# Patient Record
Sex: Female | Born: 1970 | Race: Black or African American | Hispanic: No | Marital: Single | Smoking: Never smoker
Health system: Southern US, Community
[De-identification: ages and names within clinical notes are randomized; demographics above are authoritative.]

## PROBLEM LIST (undated history)

## (undated) DIAGNOSIS — T4145XA Adverse effect of unspecified anesthetic, initial encounter: Secondary | ICD-10-CM

## (undated) DIAGNOSIS — D219 Benign neoplasm of connective and other soft tissue, unspecified: Secondary | ICD-10-CM

## (undated) DIAGNOSIS — D649 Anemia, unspecified: Secondary | ICD-10-CM

## (undated) DIAGNOSIS — Q858 Other phakomatoses, not elsewhere classified: Secondary | ICD-10-CM

## (undated) DIAGNOSIS — Q8589 Other phakomatoses, not elsewhere classified: Secondary | ICD-10-CM

## (undated) DIAGNOSIS — T8859XA Other complications of anesthesia, initial encounter: Secondary | ICD-10-CM

## (undated) DIAGNOSIS — M1990 Unspecified osteoarthritis, unspecified site: Secondary | ICD-10-CM

## (undated) HISTORY — PX: TUBAL LIGATION: SHX77

## (undated) HISTORY — PX: COLONOSCOPY: SHX174

## (undated) HISTORY — PX: APPENDECTOMY: SHX54

## (undated) HISTORY — PX: ABDOMINAL SURGERY: SHX537

---

## 1998-04-05 ENCOUNTER — Emergency Department (HOSPITAL_COMMUNITY): Admission: EM | Admit: 1998-04-05 | Discharge: 1998-04-05 | Payer: Self-pay | Admitting: Emergency Medicine

## 1998-05-30 ENCOUNTER — Emergency Department (HOSPITAL_COMMUNITY): Admission: EM | Admit: 1998-05-30 | Discharge: 1998-05-30 | Payer: Self-pay | Admitting: Emergency Medicine

## 1998-07-03 ENCOUNTER — Other Ambulatory Visit: Admission: RE | Admit: 1998-07-03 | Discharge: 1998-07-03 | Payer: Self-pay | Admitting: Internal Medicine

## 1998-07-31 ENCOUNTER — Encounter: Admission: RE | Admit: 1998-07-31 | Discharge: 1998-07-31 | Payer: Self-pay | Admitting: Obstetrics & Gynecology

## 1999-09-01 ENCOUNTER — Other Ambulatory Visit: Admission: RE | Admit: 1999-09-01 | Discharge: 1999-09-01 | Payer: Self-pay | Admitting: *Deleted

## 1999-09-01 ENCOUNTER — Other Ambulatory Visit: Admission: RE | Admit: 1999-09-01 | Discharge: 1999-09-01 | Payer: Self-pay | Admitting: Obstetrics and Gynecology

## 1999-09-10 ENCOUNTER — Inpatient Hospital Stay (HOSPITAL_COMMUNITY): Admission: AD | Admit: 1999-09-10 | Discharge: 1999-09-14 | Payer: Self-pay | Admitting: Gastroenterology

## 1999-09-12 ENCOUNTER — Encounter: Payer: Self-pay | Admitting: Gastroenterology

## 1999-10-02 ENCOUNTER — Ambulatory Visit (HOSPITAL_COMMUNITY): Admission: RE | Admit: 1999-10-02 | Discharge: 1999-10-02 | Payer: Self-pay | Admitting: Surgery

## 1999-10-02 ENCOUNTER — Encounter: Payer: Self-pay | Admitting: Surgery

## 1999-11-21 ENCOUNTER — Inpatient Hospital Stay (HOSPITAL_COMMUNITY): Admission: AD | Admit: 1999-11-21 | Discharge: 1999-11-26 | Payer: Self-pay | Admitting: Surgery

## 2000-01-22 ENCOUNTER — Ambulatory Visit (HOSPITAL_COMMUNITY): Admission: RE | Admit: 2000-01-22 | Discharge: 2000-01-22 | Payer: Self-pay | Admitting: Gastroenterology

## 2000-02-01 ENCOUNTER — Emergency Department (HOSPITAL_COMMUNITY): Admission: EM | Admit: 2000-02-01 | Discharge: 2000-02-02 | Payer: Self-pay | Admitting: Emergency Medicine

## 2000-07-04 ENCOUNTER — Other Ambulatory Visit: Admission: RE | Admit: 2000-07-04 | Discharge: 2000-07-04 | Payer: Self-pay | Admitting: *Deleted

## 2000-07-07 ENCOUNTER — Encounter: Admission: RE | Admit: 2000-07-07 | Discharge: 2000-07-07 | Payer: Self-pay | Admitting: *Deleted

## 2000-07-07 ENCOUNTER — Encounter: Payer: Self-pay | Admitting: *Deleted

## 2000-08-23 ENCOUNTER — Other Ambulatory Visit: Admission: RE | Admit: 2000-08-23 | Discharge: 2000-08-23 | Payer: Self-pay | Admitting: *Deleted

## 2000-09-15 ENCOUNTER — Other Ambulatory Visit: Admission: RE | Admit: 2000-09-15 | Discharge: 2000-09-15 | Payer: Self-pay | Admitting: Obstetrics and Gynecology

## 2000-10-21 ENCOUNTER — Ambulatory Visit (HOSPITAL_COMMUNITY): Admission: RE | Admit: 2000-10-21 | Discharge: 2000-10-21 | Payer: Self-pay | Admitting: Gastroenterology

## 2000-10-21 ENCOUNTER — Encounter: Payer: Self-pay | Admitting: Gastroenterology

## 2000-10-26 ENCOUNTER — Ambulatory Visit (HOSPITAL_COMMUNITY): Admission: RE | Admit: 2000-10-26 | Discharge: 2000-10-26 | Payer: Self-pay | Admitting: Gastroenterology

## 2000-11-11 ENCOUNTER — Ambulatory Visit (HOSPITAL_COMMUNITY): Admission: RE | Admit: 2000-11-11 | Discharge: 2000-11-11 | Payer: Self-pay | Admitting: Gastroenterology

## 2001-02-21 ENCOUNTER — Encounter: Admission: RE | Admit: 2001-02-21 | Discharge: 2001-02-21 | Payer: Self-pay | Admitting: Obstetrics and Gynecology

## 2001-02-21 ENCOUNTER — Encounter: Payer: Self-pay | Admitting: Obstetrics and Gynecology

## 2001-09-15 ENCOUNTER — Ambulatory Visit (HOSPITAL_COMMUNITY): Admission: RE | Admit: 2001-09-15 | Discharge: 2001-09-15 | Payer: Self-pay | Admitting: Gastroenterology

## 2001-09-15 ENCOUNTER — Encounter: Payer: Self-pay | Admitting: Gastroenterology

## 2001-11-16 ENCOUNTER — Encounter: Payer: Self-pay | Admitting: Obstetrics and Gynecology

## 2001-11-16 ENCOUNTER — Encounter: Admission: RE | Admit: 2001-11-16 | Discharge: 2001-11-16 | Payer: Self-pay | Admitting: Obstetrics and Gynecology

## 2001-12-20 ENCOUNTER — Ambulatory Visit (HOSPITAL_COMMUNITY): Admission: RE | Admit: 2001-12-20 | Discharge: 2001-12-20 | Payer: Self-pay | Admitting: Gastroenterology

## 2001-12-29 ENCOUNTER — Encounter: Payer: Self-pay | Admitting: *Deleted

## 2001-12-29 ENCOUNTER — Ambulatory Visit (HOSPITAL_COMMUNITY): Admission: RE | Admit: 2001-12-29 | Discharge: 2001-12-29 | Payer: Self-pay | Admitting: *Deleted

## 2002-05-04 ENCOUNTER — Encounter: Payer: Self-pay | Admitting: Obstetrics and Gynecology

## 2002-05-04 ENCOUNTER — Encounter: Admission: RE | Admit: 2002-05-04 | Discharge: 2002-05-04 | Payer: Self-pay | Admitting: Obstetrics and Gynecology

## 2002-06-04 ENCOUNTER — Ambulatory Visit (HOSPITAL_COMMUNITY): Admission: RE | Admit: 2002-06-04 | Discharge: 2002-06-04 | Payer: Self-pay | Admitting: Gastroenterology

## 2002-06-18 ENCOUNTER — Other Ambulatory Visit: Admission: RE | Admit: 2002-06-18 | Discharge: 2002-06-18 | Payer: Self-pay | Admitting: Obstetrics & Gynecology

## 2003-01-21 ENCOUNTER — Ambulatory Visit (HOSPITAL_COMMUNITY): Admission: RE | Admit: 2003-01-21 | Discharge: 2003-01-21 | Payer: Self-pay | Admitting: Gastroenterology

## 2012-02-03 DIAGNOSIS — R928 Other abnormal and inconclusive findings on diagnostic imaging of breast: Secondary | ICD-10-CM | POA: Insufficient documentation

## 2012-02-03 DIAGNOSIS — M19049 Primary osteoarthritis, unspecified hand: Secondary | ICD-10-CM | POA: Insufficient documentation

## 2012-02-03 DIAGNOSIS — G5620 Lesion of ulnar nerve, unspecified upper limb: Secondary | ICD-10-CM | POA: Insufficient documentation

## 2012-02-03 DIAGNOSIS — G44229 Chronic tension-type headache, not intractable: Secondary | ICD-10-CM | POA: Insufficient documentation

## 2012-02-03 DIAGNOSIS — M542 Cervicalgia: Secondary | ICD-10-CM | POA: Insufficient documentation

## 2012-02-11 DIAGNOSIS — F4321 Adjustment disorder with depressed mood: Secondary | ICD-10-CM | POA: Insufficient documentation

## 2014-03-29 HISTORY — PX: BREAST BIOPSY: SHX20

## 2017-08-08 ENCOUNTER — Ambulatory Visit
Admission: EM | Admit: 2017-08-08 | Discharge: 2017-08-08 | Disposition: A | Discharge status: Home / Self Care | Payer: BLUE CROSS/BLUE SHIELD | Attending: Family Medicine | Admitting: Family Medicine

## 2017-08-08 ENCOUNTER — Other Ambulatory Visit: Payer: Self-pay

## 2017-08-08 ENCOUNTER — Encounter: Payer: Self-pay | Admitting: Emergency Medicine

## 2017-08-08 DIAGNOSIS — N6452 Nipple discharge: Secondary | ICD-10-CM | POA: Diagnosis not present

## 2017-08-08 DIAGNOSIS — N898 Other specified noninflammatory disorders of vagina: Secondary | ICD-10-CM | POA: Diagnosis not present

## 2017-08-08 DIAGNOSIS — N6310 Unspecified lump in the right breast, unspecified quadrant: Secondary | ICD-10-CM

## 2017-08-08 DIAGNOSIS — N760 Acute vaginitis: Secondary | ICD-10-CM

## 2017-08-08 DIAGNOSIS — B9689 Other specified bacterial agents as the cause of diseases classified elsewhere: Secondary | ICD-10-CM | POA: Diagnosis not present

## 2017-08-08 DIAGNOSIS — N6341 Unspecified lump in right breast, subareolar: Secondary | ICD-10-CM | POA: Diagnosis not present

## 2017-08-08 HISTORY — DX: Other phakomatoses, not elsewhere classified: Q85.8

## 2017-08-08 HISTORY — DX: Other phakomatoses, not elsewhere classified: Q85.89

## 2017-08-08 LAB — WET PREP, GENITAL
Sperm: NONE SEEN
Trich, Wet Prep: NONE SEEN
Yeast Wet Prep HPF POC: NONE SEEN

## 2017-08-08 MED ORDER — METRONIDAZOLE 500 MG PO TABS: 500.0000 mg | ORAL_TABLET | Freq: Two times a day (BID) | ORAL | 0 refills | Status: DC

## 2017-08-08 NOTE — Discharge Instructions (Addendum)
Recommend patient follow up this week with breast surgeon for further evaluation and management

## 2017-08-08 NOTE — ED Triage Notes (Addendum)
Patient in today c/o right breast nipple discharge x 3 weeks. Patient states this happened last month during her menstrual cycle, but the discharged stopped once her cycle completed. This time the discharged has continued now for 2 weeks past her cycle. Patient has an appointment to establish care with OB/GYN the end of June.

## 2017-08-08 NOTE — ED Provider Notes (Signed)
[facility]    CSN: [id number] Arrival date & time: 08/08/17  1536     History   Chief Complaint Chief Complaint  Patient presents with  . Breast Discharge    HPI Ann Kennedy is a 47 y.o. female.   47 yo female with a c/o right breast nipple discharge for the past 3 weeks. States discharge is mostly yellowish color but also sometimes bloody and only from the right breast. Denies any trauma, skin redness, rash, fevers, chills. States also has a lump under the nipple that has been present for years and was biopsied 2 years ago at [facility]. Patient states she was told at the time that it was a fibroadenoma.   Patient also c/o a mild vaginal discharge for the past few weeks. Denies any pain or fevers. States not concerned about STDs and states can do a self swab for wet prep.   The history is provided by the patient.    Past Medical History:  Diagnosis Date  . 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   Past Surgical History:  Procedure Laterality Date  . ABDOMINAL SURGERY     Puetz-Jeghers Syndrome  . APPENDECTOMY      OB History   None      Home Medications    Prior to Admission medications   Medication Sig Start Date End Date Taking? Authorizing Provider  metroNIDAZOLE (FLAGYL) 500 MG tablet Take 1 tablet (500 mg total) by mouth 2 (two) times daily. 08/08/17   Norval Gable, MD    Family History Family History  Problem Relation Age of Onset  . Colon cancer Father   . Other Father        Peutz-Jegher syndrome    Social History Social History   Tobacco Use  . Smoking status: Never Smoker  . Smokeless tobacco: Never Used  Substance Use Topics  . Alcohol use: Yes    Alcohol/week: 1.2 oz    Types: 2 Glasses of wine per week  . Drug use: Never     Allergies   Aspirin   Review of Systems Review of Systems   Physical Exam Triage Vital Signs ED Triage Vitals  Enc Vitals Group       BP 08/08/17 1552 108/72     Pulse Rate 08/08/17 1552 80     Resp 08/08/17 1552 16     Temp 08/08/17 1552 98.4 F (36.9 C)     Temp Source 08/08/17 1552 Oral     SpO2 08/08/17 1552 100 %     Weight 08/08/17 1553 143 lb (64.9 kg)     Height 08/08/17 1553 5' 6.5" (1.689 m)     Head Circumference --      Peak Flow --      Pain Score 08/08/17 1552 0     Pain Loc --      Pain Edu? --      Excl. in [place]? --    No data found.  Updated Vital Signs BP 108/72 (BP Location: Left Arm)   Pulse 80   Temp 98.4 F (36.9 C) (Oral)   Resp 16   Ht 5' 6.5" (1.689 m)   Wt 143 lb (64.9 kg)   LMP 07/18/2017 (Approximate)   SpO2 100%   BMI 22.74 kg/m   Visual Acuity Right Eye Distance:   Left Eye Distance:   Bilateral Distance:    Right Eye Near:  Left Eye Near:    Bilateral Near:     Physical Exam  Constitutional: She appears well-developed and well-nourished. No distress.  Pulmonary/Chest: Effort normal. No respiratory distress. Right breast exhibits mass. There is breast discharge (serousanguinous ).    Skin: She is not diaphoretic.  Nursing note and vitals reviewed.    UC Treatments / Results  Labs (all labs ordered are listed, but only abnormal results are displayed) Labs Reviewed  WET PREP, GENITAL - Abnormal; Notable for the following components:      Result Value   Clue Cells Wet Prep HPF POC PRESENT (*)    WBC, Wet Prep HPF POC FEW (*)    All other components within normal limits  AEROBIC CULTURE (SUPERFICIAL SPECIMEN)   (Self wet prep sample)  EKG None  Radiology No results found.  Procedures Procedures (including critical care time)  Medications Ordered in UC Medications - No data to display  Initial Impression / Assessment and Plan / UC Course  I have reviewed the triage vital signs and the nursing notes.  Pertinent labs & imaging results that were available during my care of the patient were reviewed by me and considered in my medical decision  making (see chart for details).     Final Clinical Impressions(s) / UC Diagnoses   Final diagnoses:  Bloody discharge from right nipple  Breast mass, right  BV (bacterial vaginosis)     Discharge Instructions     Recommend patient follow up this week with breast surgeon for further evaluation and management    ED Prescriptions    Medication Sig Dispense Auth. Provider   metroNIDAZOLE (FLAGYL) 500 MG tablet Take 1 tablet (500 mg total) by mouth 2 (two) times daily. 14 tablet Norval Gable, MD     1. Lab results and diagnosis reviewed with patient  2. rx as per orders above; reviewed possible side effects, interactions, risks and benefits  3. Recommend follow up with breast surgeon for further evaluation of her breast mass and bloody breast discharge; appointment scheduled for this week 4. Follow-up prn if symptoms worsen or don't improve   Controlled Substance Prescriptions [place] Controlled Substance Registry consulted? Not Applicable   Norval Gable, MD 08/08/17 2121

## 2017-08-08 NOTE — ED Triage Notes (Signed)
Appointment made with [facility], Dr. Dahlia Byes for Wednesday (08/10/17) at 2:30pm to be worked in. Patient notified of appointment and voiced understanding.

## 2017-08-09 DIAGNOSIS — Q8589 Other phakomatoses, not elsewhere classified: Secondary | ICD-10-CM | POA: Insufficient documentation

## 2017-08-09 DIAGNOSIS — Q858 Other phakomatoses, not elsewhere classified: Secondary | ICD-10-CM | POA: Insufficient documentation

## 2017-08-09 DIAGNOSIS — D649 Anemia, unspecified: Secondary | ICD-10-CM | POA: Insufficient documentation

## 2017-08-10 ENCOUNTER — Telehealth: Payer: Self-pay

## 2017-08-10 ENCOUNTER — Encounter: Payer: Self-pay | Admitting: Surgery

## 2017-08-10 ENCOUNTER — Ambulatory Visit: Payer: BLUE CROSS/BLUE SHIELD | Admitting: Surgery

## 2017-08-10 VITALS — BP 120/78 | HR 82 | Temp 98.3°F | Ht 66.0 in | Wt 154.4 lb

## 2017-08-10 DIAGNOSIS — N6452 Nipple discharge: Secondary | ICD-10-CM | POA: Diagnosis not present

## 2017-08-10 DIAGNOSIS — D241 Benign neoplasm of right breast: Secondary | ICD-10-CM | POA: Diagnosis not present

## 2017-08-10 NOTE — Patient Instructions (Addendum)
We would like for you to stop by [facility] breast center and sign a release of information so we can get a disc of the images of your Mammogram from 2016 you had done at [facility].  Once [facility] has the images we can then schedule the mammogram and Ultrasound and the follow up appointment with Dr.Pabon.   I will call you with the appointment information.

## 2017-08-10 NOTE — Telephone Encounter (Signed)
Spoke with Suanne Marker @ [facility] to schedule Mammogram and Ultrasound, however images are needed from [facility] before proceeding with appointment.  Patient was instructed after todays office visit to stop by [facility] and sign a release to obtain images from [place].

## 2017-08-10 NOTE — Progress Notes (Signed)
Patient ID: Ann Kennedy, female   DOB: 06/28/1970, 47 y.o.   MRN: [id number]  HPI Ann Kennedy is a 47 y.o. female seen in consultation at the request of Dr. Zenda Alpers.  3 she describes that she has been having right breast drainage for the last couple of years and over the last 3 weeks she sees some serious combined with some sanguinous drainage.  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 She denies any significant pain.  She does have a history of fibromyalgia with body aches.  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 No specific past medical history of breast cancer.  She does have some fibroids but no evidence of oophorectomies or hysterectomy. Menarche at age 65 history of Depo Provera several years ago now has tubal ligation.  She is perimenopausal. Is able to perform more than 4 METS of activity without any shortness of breath or chest pain.  HPI  Past Medical History:  Diagnosis Date  . Peutz-Jeghers syndrome [facility])     Past Surgical History:  Procedure Laterality Date  . ABDOMINAL SURGERY     Puetz-Jeghers Syndrome  . APPENDECTOMY      Family History  Problem Relation Age of Onset  . Colon cancer Father   . Other Father        Peutz-Jegher syndrome    Social History Social History   Tobacco Use  . Smoking status: Never Smoker  . Smokeless tobacco: Never Used  Substance Use Topics  . Alcohol use: Yes    Alcohol/week: 1.2 oz    Types: 2 Glasses of wine per week  . Drug use: Never    Allergies  Allergen Reactions  . Aspirin Nausea And Vomiting    Abdominal pain    Current Outpatient Medications  Medication Sig Dispense Refill  . amitriptyline (ELAVIL) 10 MG tablet TAKE 1TABLET BY MOUTH AT NIGHT AS NEEDED FOR SLEEP OR PAIN    . Ferrous Sulfate 134 MG  TABS Take by mouth.    . metroNIDAZOLE (FLAGYL) 500 MG tablet Take 1 tablet (500 mg total) by mouth 2 (two) times daily. 14 tablet 0  . Na Sulfate-K Sulfate-Mg Sulf (SUPREP BOWEL PREP KIT) 17.5-3.13-1.6 GM/177ML SOLN At 6 PM day before procedure, complete steps 1-4 using one 6 ounce bottle.  At 5 AM the day of procedure, repeat steps 1-4 using 2nd bottle.    . prenatal vitamin w/FE, FA (PRENATAL 1 + 1) 27-1 MG TABS tablet Take by mouth.     No current facility-administered medications for this visit.      Review of Systems Full ROS  was asked and was negative except for the information on the HPI  Physical Exam Blood pressure 120/78, pulse 82, temperature 98.3 F (36.8 C), temperature source Oral, height '5\' 6"'$  (1.676 m), weight 70 kg (154 lb 6.4 oz), last menstrual period 07/18/2017. CONSTITUTIONAL: NAD EYES: Pupils are equal, round, and reactive to light, Sclera are non-icteric. EARS, NOSE, MOUTH AND THROAT: The oropharynx is clear. The oral mucosa is pink and moist. Hearing is intact to voice. LYMPH NODES:  Lymph nodes in the neck are normal. BREAST: There is serous discharge from the right nipple.  There is a retroareolar density mass like tissue but I suspect this is her baseline dense breast tissue.  No  evidence of other additional masses or lesions within the skin.  Left breast is completely normal.  No evidence of axillary.lymphadenopathy RESPIRATORY:  Lungs are clear. There is normal respiratory effort, with equal breath sounds bilaterally, and without pathologic use of accessory muscles. CARDIOVASCULAR: Heart is regular without murmurs, gallops, or rubs. GI: The abdomen is  soft, nontender, and nondistended. There are no palpable masses. There is no hepatosplenomegaly. There are normal bowel sounds in all quadrants. GU: Rectal deferred.   MUSCULOSKELETAL: Normal muscle strength and tone. No cyanosis or edema.   SKIN: Turgor is good and there are no pathologic skin lesions or  ulcers. NEUROLOGIC: Motor and sensation is grossly normal. Cranial nerves are grossly intact. PSYCH:  Oriented to person, place and time. Affect is normal.  Data Reviewed I have personally reviewed the patient's imaging, laboratory findings and medical records.    Assessment/Plan 47 year old female with a right breast DISCHARGE consistent with ductal pathology.  Discussed with the patient in detail about her disease process.  First order of business is to obtain ultrasound and mammogram to rule out any potential malignancies.  I suspect that she may benefit from central ductal excision since this is a chronic problem.  We discussed briefly in detail about what this entails.  I will see her back in about 2 weeks after we complete her diagnostic his studies.  No need for emergent surgical intervention at this time.        Caroleen Hamman, MD FACS General Surgeon 08/10/2017, 5:10 PM

## 2017-08-11 LAB — AEROBIC CULTURE W GRAM STAIN (SUPERFICIAL SPECIMEN)
Culture: NO GROWTH
Gram Stain: NONE SEEN
Special Requests: NORMAL

## 2017-08-26 NOTE — Telephone Encounter (Signed)
Spoke with York Cerise  A request of authorization for images were re-faxed to [place]. As soon as they are received York Cerise will call me to let me know when Mammogram will be scheduled.

## 2017-08-30 ENCOUNTER — Inpatient Hospital Stay
Admission: RE | Admit: 2017-08-30 | Discharge: 2017-08-30 | Disposition: A | Discharge status: Home / Self Care | Payer: Self-pay | Source: Ambulatory Visit | Attending: *Deleted | Admitting: *Deleted

## 2017-08-30 ENCOUNTER — Other Ambulatory Visit: Payer: Self-pay | Admitting: *Deleted

## 2017-08-30 DIAGNOSIS — Z9289 Personal history of other medical treatment: Secondary | ICD-10-CM

## 2017-08-31 NOTE — Telephone Encounter (Signed)
Left message for patient to return call to office regarding follow up appointment listed below.  Dr.Pabon 09/21/17 @ 10 am to review Mammogram and Ultrasound results.

## 2017-09-01 ENCOUNTER — Ambulatory Visit: Payer: Self-pay | Admitting: Surgery

## 2017-09-02 ENCOUNTER — Ambulatory Visit
Admission: RE | Admit: 2017-09-02 | Discharge: 2017-09-02 | Disposition: A | Discharge status: Home / Self Care | Payer: BLUE CROSS/BLUE SHIELD | Source: Ambulatory Visit | Attending: Surgery | Admitting: Surgery

## 2017-09-02 DIAGNOSIS — D241 Benign neoplasm of right breast: Secondary | ICD-10-CM | POA: Insufficient documentation

## 2017-09-05 ENCOUNTER — Other Ambulatory Visit: Payer: Self-pay | Admitting: Surgery

## 2017-09-05 DIAGNOSIS — N6310 Unspecified lump in the right breast, unspecified quadrant: Secondary | ICD-10-CM

## 2017-09-05 DIAGNOSIS — R928 Other abnormal and inconclusive findings on diagnostic imaging of breast: Secondary | ICD-10-CM

## 2017-09-06 NOTE — Telephone Encounter (Signed)
Patient is having breast biopsy on 09/15/17 at [facility]. Patient is aware. Patient is also aware of her appointment to follow up with Dr Dahlia Byes on 09/21/17.

## 2017-09-06 NOTE — Telephone Encounter (Signed)
Patient has called back and appointment information has been given. Patient does have to have a biopsy of the breast. She is calling [facility] at this time to arrange this. I have informed patient that depending on the date of this biopsy being done, her appointment with Dr Dahlia Byes may need to be adjusted so that we can see her after the biopsy to discuss results and or treatment options.

## 2017-09-15 ENCOUNTER — Ambulatory Visit
Admission: RE | Admit: 2017-09-15 | Discharge: 2017-09-15 | Disposition: A | Discharge status: Home / Self Care | Payer: BLUE CROSS/BLUE SHIELD | Source: Ambulatory Visit | Attending: Surgery | Admitting: Surgery

## 2017-09-15 DIAGNOSIS — R928 Other abnormal and inconclusive findings on diagnostic imaging of breast: Secondary | ICD-10-CM

## 2017-09-15 DIAGNOSIS — N6310 Unspecified lump in the right breast, unspecified quadrant: Secondary | ICD-10-CM | POA: Diagnosis present

## 2017-09-16 LAB — SURGICAL PATHOLOGY

## 2017-09-20 ENCOUNTER — Telehealth: Payer: Self-pay

## 2017-09-21 ENCOUNTER — Ambulatory Visit: Payer: Self-pay | Admitting: Surgery

## 2017-09-21 NOTE — Telephone Encounter (Signed)
Error

## 2017-09-22 ENCOUNTER — Ambulatory Visit: Payer: Self-pay | Admitting: Surgery

## 2017-10-03 ENCOUNTER — Ambulatory Visit: Payer: Self-pay | Admitting: Surgery

## 2017-10-05 ENCOUNTER — Telehealth: Payer: Self-pay

## 2017-10-05 NOTE — Telephone Encounter (Signed)
Tried reaching patient due to no show and message was left to call office regarding missed appointment.

## 2017-10-12 ENCOUNTER — Encounter: Payer: Self-pay | Admitting: Surgery

## 2017-10-12 ENCOUNTER — Ambulatory Visit: Payer: BLUE CROSS/BLUE SHIELD | Admitting: Surgery

## 2017-10-12 VITALS — BP 118/82 | HR 108 | Temp 97.8°F | Wt 152.0 lb

## 2017-10-12 DIAGNOSIS — D241 Benign neoplasm of right breast: Secondary | ICD-10-CM | POA: Diagnosis not present

## 2017-10-12 NOTE — Progress Notes (Signed)
Outpatient Surgical Follow Up  10/12/2017  RANI IDLER is an 47 y.o. female.   Chief Complaint  Patient presents with  . Follow-up    right breast    HPI: 47 year old female well-known to me that I saw 2 months ago for pathological nipple discharge.  I did send her for an ultrasound and mammogram show retroareolar breast mass that prompted a core needle biopsy.  Please note that I have personally reviewed these images.  He did have 2 biopsies one at 6:00 and the other one at 8:00.  The lesion at 8:00 revealed intraductal papilloma with usual hyperplasia.  The one at 6:00 revealed also intraductal papilloma with ductal hyperplasia. He continues to have daily pathological breast discharge on the right side.  There is some intermittent mild to moderate sharp pains in the right breast.  No specific alleviating or aggravating factors.  Is able to perform more than 6 METS of activity without any shortness of breath or chest pain   Past Medical History:  Diagnosis Date  . Peutz-Jeghers syndrome [facility])     Past Surgical History:  Procedure Laterality Date  . ABDOMINAL SURGERY     Puetz-Jeghers Syndrome  . APPENDECTOMY    . BREAST BIOPSY Right 2016   benign    Family History  Problem Relation Age of Onset  . Colon cancer Father   . Other Father        Peutz-Jegher syndrome    Social History:  reports that she has never smoked. She has never used smokeless tobacco. She reports that she drinks about 1.2 oz of alcohol per week. She reports that she does not use drugs.  Allergies:  Allergies  Allergen Reactions  . Aspirin Nausea And Vomiting    Abdominal pain    Medications reviewed.    ROS Full ROS performed and is otherwise negative other than what is stated in HPI   BP 118/82   Pulse (!) 108   Temp 97.8 F (36.6 C) (Oral)   Wt 68.9 kg (152 lb)   BMI 24.53 kg/m   Physical Exam  Constitutional: She is oriented to person, place, and time. She appears  well-developed and well-nourished. No distress.  Eyes: Conjunctivae and EOM are normal.  Neck: Normal range of motion. Neck supple. No JVD present. No tracheal deviation present. No thyromegaly present.  Cardiovascular: Normal rate, regular rhythm and normal heart sounds.  Pulmonary/Chest: Effort normal and breath sounds normal.  BREAST: There is some tenderness to palpation in the right breast with some induration in the retroareolar area.  There is a pathological dark discharge from a duct located around 12:00. Left breast without pathologic abnormalities and no discharge.  Axillas are free of any disease  Abdominal: Soft. Bowel sounds are normal. She exhibits no distension and no mass. There is no tenderness. There is no guarding.  Musculoskeletal: Normal range of motion. She exhibits no edema or deformity.  Neurological: She is alert and oriented to person, place, and time. She displays normal reflexes. No cranial nerve deficit or sensory deficit. She exhibits normal muscle tone. Coordination normal.  Skin: Skin is warm and dry. She is not diaphoretic.  Psychiatric: She has a normal mood and affect. Her behavior is normal. Judgment and thought content normal.  Nursing note and vitals reviewed.      Assessment/Plan:   1. 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  Discussedt with the patient in  detail about her disease process and the next step will be to perform a central duct excision.  Discussed with the patient detail about the procedure.  Risk benefit and possible complications including but not limited to: Bleeding, infection, re-intervention, upstaging of the pathology to DCIS or even invasive cancer.  Chance for nipple/ breast deformity.  She understands and wishes to proceed.     Caroleen Hamman, MD [facility] General Surgeon

## 2017-10-12 NOTE — H&P (View-Only) (Signed)
Outpatient Surgical Follow Up  10/12/2017  Ann Kennedy is an 47 y.o. female.   Chief Complaint  Patient presents with  . Follow-up    right breast    HPI: 47 year old female well-known to me that I saw 2 months ago for pathological nipple discharge.  I did send her for an ultrasound and mammogram show retroareolar breast mass that prompted a core needle biopsy.  Please note that I have personally reviewed these images.  He did have 2 biopsies one at 6:00 and the other one at 8:00.  The lesion at 8:00 revealed intraductal papilloma with usual hyperplasia.  The one at 6:00 revealed also intraductal papilloma with ductal hyperplasia. He continues to have daily pathological breast discharge on the right side.  There is some intermittent mild to moderate sharp pains in the right breast.  No specific alleviating or aggravating factors.  Is able to perform more than 6 METS of activity without any shortness of breath or chest pain   Past Medical History:  Diagnosis Date  . Peutz-Jeghers syndrome [facility])     Past Surgical History:  Procedure Laterality Date  . ABDOMINAL SURGERY     Puetz-Jeghers Syndrome  . APPENDECTOMY    . BREAST BIOPSY Right 2016   benign    Family History  Problem Relation Age of Onset  . Colon cancer Father   . Other Father        Peutz-Jegher syndrome    Social History:  reports that she has never smoked. She has never used smokeless tobacco. She reports that she drinks about 1.2 oz of alcohol per week. She reports that she does not use drugs.  Allergies:  Allergies  Allergen Reactions  . Aspirin Nausea And Vomiting    Abdominal pain    Medications reviewed.    ROS Full ROS performed and is otherwise negative other than what is stated in HPI   BP 118/82   Pulse (!) 108   Temp 97.8 F (36.6 C) (Oral)   Wt 68.9 kg (152 lb)   BMI 24.53 kg/m   Physical Exam  Constitutional: She is oriented to person, place, and time. She appears  well-developed and well-nourished. No distress.  Eyes: Conjunctivae and EOM are normal.  Neck: Normal range of motion. Neck supple. No JVD present. No tracheal deviation present. No thyromegaly present.  Cardiovascular: Normal rate, regular rhythm and normal heart sounds.  Pulmonary/Chest: Effort normal and breath sounds normal.  BREAST: There is some tenderness to palpation in the right breast with some induration in the retroareolar area.  There is a pathological dark discharge from a duct located around 12:00. Left breast without pathologic abnormalities and no discharge.  Axillas are free of any disease  Abdominal: Soft. Bowel sounds are normal. She exhibits no distension and no mass. There is no tenderness. There is no guarding.  Musculoskeletal: Normal range of motion. She exhibits no edema or deformity.  Neurological: She is alert and oriented to person, place, and time. She displays normal reflexes. No cranial nerve deficit or sensory deficit. She exhibits normal muscle tone. Coordination normal.  Skin: Skin is warm and dry. She is not diaphoretic.  Psychiatric: She has a normal mood and affect. Her behavior is normal. Judgment and thought content normal.  Nursing note and vitals reviewed.      Assessment/Plan:   1. 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  Discussedt with the patient in  detail about her disease process and the next step will be to perform a central duct excision.  Discussed with the patient detail about the procedure.  Risk benefit and possible complications including but not limited to: Bleeding, infection, re-intervention, upstaging of the pathology to DCIS or even invasive cancer.  Chance for nipple/ breast deformity.  She understands and wishes to proceed.     Caroleen Hamman, MD [facility] General Surgeon

## 2017-10-12 NOTE — Patient Instructions (Addendum)
We have spoken today about removing a lump in your breast. This will be done on 10/27/2017 by Dr. Dahlia Byes at [facility].  You will most likely be able to leave the hospital several hours after your surgery. Rarely, a patient needs to stay over night but this is a possibility.

## 2017-10-14 ENCOUNTER — Telehealth: Payer: Self-pay | Admitting: Surgery

## 2017-10-14 NOTE — Telephone Encounter (Signed)
I have called patient to go over surgery information. No answer. I have left a message on patient's voicemail.    pre op date/time and sx date. Sx: 10/27/17 with Dr Othelia Pulling breast duct excision. Pre op: 10/20/17 between 1-5:00pm-phone interview.  Make patient aware to call [phone], between 1-3:00pm the day before surgery, to find out what time to arrive.

## 2017-10-17 NOTE — Telephone Encounter (Signed)
Instructions reviewed with the patient

## 2017-10-20 ENCOUNTER — Inpatient Hospital Stay: Admission: RE | Admit: 2017-10-20 | Payer: BLUE CROSS/BLUE SHIELD | Source: Ambulatory Visit

## 2017-10-21 ENCOUNTER — Encounter
Admission: RE | Admit: 2017-10-21 | Discharge: 2017-10-21 | Disposition: A | Discharge status: Home / Self Care | Payer: BLUE CROSS/BLUE SHIELD | Source: Ambulatory Visit | Attending: Surgery | Admitting: Surgery

## 2017-10-21 ENCOUNTER — Other Ambulatory Visit: Payer: Self-pay

## 2017-10-21 HISTORY — DX: Other complications of anesthesia, initial encounter: T88.59XA

## 2017-10-21 HISTORY — DX: Unspecified osteoarthritis, unspecified site: M19.90

## 2017-10-21 HISTORY — DX: Adverse effect of unspecified anesthetic, initial encounter: T41.45XA

## 2017-10-21 HISTORY — DX: Anemia, unspecified: D64.9

## 2017-10-21 NOTE — Patient Instructions (Addendum)
Your procedure is scheduled on: 10-27-17 THURSDAY Report to Same Day Surgery 2nd floor medical mall [facility] Entrance-take elevator on left to 2nd floor.  Check in with surgery information desk.) To find out your arrival time please call [phone] between 1PM - 3PM on 10-26-17 [facility]  Remember: Instructions that are not followed completely may result in serious medical risk, up to and including death, or upon the discretion of your surgeon and anesthesiologist your surgery may need to be rescheduled.    _x___ 1. Do not eat food after midnight the night before your procedure. You may drink clear liquids up to 2 hours before you are scheduled to arrive at the hospital for your procedure.  Do not drink clear liquids within 2 hours of your scheduled arrival to the hospital.  Clear liquids include  --Water or Apple juice without pulp  --Clear carbohydrate beverage such as ClearFast or Gatorade  --Black Coffee or Clear Tea (No milk, no creamers, do not add anything to  the coffee or Tea Type 1 and type 2 diabetics should only drink water.  No gum chewing or hard candies.     __x__ 2. No Alcohol for 24 hours before or after surgery.   __x__3. No Smoking or e-cigarettes for 24 prior to surgery.  Do not use any chewable tobacco products for at least 6 hour prior to surgery   ____  4. Bring all medications with you on the day of surgery if instructed.    __x__ 5. Notify your doctor if there is any change in your medical condition     (cold, fever, infections).    x___6. On the morning of surgery brush your teeth with toothpaste and water.  You may rinse your mouth with mouth wash if you wish.  Do not swallow any toothpaste or mouthwash.   Do not wear jewelry, make-up, hairpins, clips or nail polish.  Do not wear lotions, powders, or perfumes. You may wear deodorant.  Do not shave 48 hours prior to surgery. Men may shave face and neck.  Do not bring valuables to the hospital.    [facility] is not responsible for any belongings or valuables.               Contacts, dentures or bridgework may not be worn into surgery.  Leave your suitcase in the car. After surgery it may be brought to your room.  For patients admitted to the hospital, discharge time is determined by your treatment team.  _  Patients discharged the day of surgery will not be allowed to drive home.  You will need someone to drive you home and stay with you the night of your procedure.    Please read over the following fact sheets that you were given:   [facility] Preparing for Surgery   ____ Take anti-hypertensive listed below, cardiac, seizure, asthma,  anti-reflux and psychiatric medicines. These include:  1. NONE  2.  3.  4.  5.  6.  ____Fleets enema or Magnesium Citrate as directed.   _x___ Use CHG Soap or sage wipes as directed on instruction sheet   ____ Use inhalers on the day of surgery and bring to hospital day of surgery  ____ Stop Metformin and Janumet 2 days prior to surgery.    ____ Take 1/2 of usual insulin dose the night before surgery and none on the morning surgery.   ____ Follow recommendations from Cardiologist, Pulmonologist or PCP regarding stopping Aspirin, Coumadin, Plavix ,  Eliquis, Effient, or Pradaxa, and Pletal.  X____Stop Anti-inflammatories such as Advil, Aleve, Ibuprofen, Motrin, Naproxen, Naprosyn, Goodies powders or aspirin products NOW-OK to take Tylenol    ____ Stop supplements until after surgery.     ____ Bring C-Pap to the hospital.

## 2017-10-24 ENCOUNTER — Telehealth: Payer: Self-pay | Admitting: Surgery

## 2017-10-24 NOTE — Telephone Encounter (Signed)
Patient would like to speak to nurse. She has questions about her procedure on 8/1. Please advise

## 2017-10-24 NOTE — Telephone Encounter (Signed)
Called patient back and she wanted to know if Dr. Dahlia Byes was going to remove both lesions. I told her that I would put her on hold since Dr. Dahlia Byes was still in the office. Patient agreed. I came back to the line and told her that Dr. Dahlia Byes stated that he would be removing both lesions. Patient was happy to hear and had no further questions.

## 2017-10-24 NOTE — Telephone Encounter (Signed)
Patient called back asking for one of the nurses to call her soon as one of you get a chance. Please call patient and advise.

## 2017-10-26 ENCOUNTER — Encounter
Admission: RE | Admit: 2017-10-26 | Discharge: 2017-10-26 | Disposition: A | Discharge status: Home / Self Care | Payer: BLUE CROSS/BLUE SHIELD | Source: Ambulatory Visit | Attending: Surgery | Admitting: Surgery

## 2017-10-26 DIAGNOSIS — D241 Benign neoplasm of right breast: Secondary | ICD-10-CM | POA: Insufficient documentation

## 2017-10-26 DIAGNOSIS — Z01818 Encounter for other preprocedural examination: Secondary | ICD-10-CM | POA: Insufficient documentation

## 2017-10-26 LAB — HEMOGLOBIN: Hemoglobin: 10.4 g/dL — ABNORMAL LOW (ref 12.0–16.0)

## 2017-10-26 MED ORDER — CEFAZOLIN SODIUM-DEXTROSE 2-4 GM/100ML-% IV SOLN
2.0000 g | INTRAVENOUS | Status: AC
  Administered 2017-10-27: 2 g via INTRAVENOUS

## 2017-10-27 ENCOUNTER — Ambulatory Visit: Payer: BLUE CROSS/BLUE SHIELD

## 2017-10-27 ENCOUNTER — Ambulatory Visit: Payer: BLUE CROSS/BLUE SHIELD | Admitting: Anesthesiology

## 2017-10-27 ENCOUNTER — Ambulatory Visit
Admission: RE | Admit: 2017-10-27 | Discharge: 2017-10-27 | Disposition: A | Discharge status: Home / Self Care | Payer: BLUE CROSS/BLUE SHIELD | Source: Ambulatory Visit | Attending: Surgery | Admitting: Surgery

## 2017-10-27 ENCOUNTER — Encounter: Payer: Self-pay | Admitting: Anesthesiology

## 2017-10-27 ENCOUNTER — Encounter
Admission: RE | Disposition: A | Discharge status: Home / Self Care | Payer: Self-pay | Source: Ambulatory Visit | Attending: Surgery

## 2017-10-27 DIAGNOSIS — N6310 Unspecified lump in the right breast, unspecified quadrant: Secondary | ICD-10-CM | POA: Diagnosis not present

## 2017-10-27 DIAGNOSIS — Q858 Other phakomatoses, not elsewhere classified: Secondary | ICD-10-CM | POA: Insufficient documentation

## 2017-10-27 DIAGNOSIS — N6041 Mammary duct ectasia of right breast: Secondary | ICD-10-CM | POA: Diagnosis not present

## 2017-10-27 DIAGNOSIS — D241 Benign neoplasm of right breast: Secondary | ICD-10-CM | POA: Diagnosis not present

## 2017-10-27 DIAGNOSIS — M1990 Unspecified osteoarthritis, unspecified site: Secondary | ICD-10-CM | POA: Diagnosis not present

## 2017-10-27 DIAGNOSIS — N63 Unspecified lump in unspecified breast: Secondary | ICD-10-CM | POA: Diagnosis present

## 2017-10-27 HISTORY — PX: BREAST DUCTAL SYSTEM EXCISION: SHX5242

## 2017-10-27 HISTORY — PX: BREAST EXCISIONAL BIOPSY: SUR124

## 2017-10-27 LAB — POCT PREGNANCY, URINE: Preg Test, Ur: NEGATIVE

## 2017-10-27 SURGERY — EXCISION DUCTAL SYSTEM BREAST
Anesthesia: General | Laterality: Right | Wound class: Clean

## 2017-10-27 MED ORDER — MIDAZOLAM HCL 2 MG/2ML IJ SOLN
INTRAMUSCULAR | Status: AC
  Filled 2017-10-27: qty 2

## 2017-10-27 MED ORDER — HYDROCODONE-ACETAMINOPHEN 5-325 MG PO TABS: 1.0000 | ORAL_TABLET | Freq: Four times a day (QID) | ORAL | 0 refills | Status: DC | PRN

## 2017-10-27 MED ORDER — ONDANSETRON HCL 4 MG/2ML IJ SOLN
INTRAMUSCULAR | Status: DC | PRN
  Administered 2017-10-27: 4 mg via INTRAVENOUS

## 2017-10-27 MED ORDER — ONDANSETRON HCL 4 MG/2ML IJ SOLN
INTRAMUSCULAR | Status: AC
  Filled 2017-10-27: qty 2

## 2017-10-27 MED ORDER — FENTANYL CITRATE (PF) 250 MCG/5ML IJ SOLN
INTRAMUSCULAR | Status: AC
  Filled 2017-10-27: qty 5

## 2017-10-27 MED ORDER — DEXAMETHASONE SODIUM PHOSPHATE 10 MG/ML IJ SOLN
INTRAMUSCULAR | Status: DC | PRN
  Administered 2017-10-27: 10 mg via INTRAVENOUS

## 2017-10-27 MED ORDER — ONDANSETRON HCL 4 MG/2ML IJ SOLN
INTRAMUSCULAR | Status: AC
  Administered 2017-10-27: 4 mg via INTRAVENOUS
  Filled 2017-10-27: qty 2

## 2017-10-27 MED ORDER — CEFAZOLIN SODIUM-DEXTROSE 2-4 GM/100ML-% IV SOLN
INTRAVENOUS | Status: AC
  Filled 2017-10-27: qty 100

## 2017-10-27 MED ORDER — ONDANSETRON HCL 4 MG/2ML IJ SOLN
4.0000 mg | Freq: Once | INTRAMUSCULAR | Status: AC
  Administered 2017-10-27: 4 mg via INTRAVENOUS

## 2017-10-27 MED ORDER — OXYCODONE HCL 5 MG/5ML PO SOLN: 5.0000 mg | Freq: Once | ORAL | Status: AC | PRN

## 2017-10-27 MED ORDER — FENTANYL CITRATE (PF) 100 MCG/2ML IJ SOLN
25.0000 ug | INTRAMUSCULAR | Status: DC | PRN
  Administered 2017-10-27 (×4): 25 ug via INTRAVENOUS

## 2017-10-27 MED ORDER — FENTANYL CITRATE (PF) 100 MCG/2ML IJ SOLN
INTRAMUSCULAR | Status: AC
  Administered 2017-10-27: 25 ug via INTRAVENOUS
  Filled 2017-10-27: qty 2

## 2017-10-27 MED ORDER — DEXAMETHASONE SODIUM PHOSPHATE 10 MG/ML IJ SOLN
INTRAMUSCULAR | Status: AC
  Filled 2017-10-27: qty 1

## 2017-10-27 MED ORDER — OXYCODONE HCL 5 MG PO TABS
ORAL_TABLET | ORAL | Status: AC
  Filled 2017-10-27: qty 1

## 2017-10-27 MED ORDER — PHENYLEPHRINE HCL 10 MG/ML IJ SOLN
INTRAMUSCULAR | Status: DC | PRN
  Administered 2017-10-27 (×4): 100 ug via INTRAVENOUS

## 2017-10-27 MED ORDER — CHLORHEXIDINE GLUCONATE CLOTH 2 % EX PADS
6.0000 | MEDICATED_PAD | Freq: Once | CUTANEOUS | Status: AC
  Administered 2017-10-27: 6 via TOPICAL

## 2017-10-27 MED ORDER — FENTANYL CITRATE (PF) 100 MCG/2ML IJ SOLN
INTRAMUSCULAR | Status: DC | PRN
  Administered 2017-10-27: 50 ug via INTRAVENOUS
  Administered 2017-10-27 (×5): 25 ug via INTRAVENOUS

## 2017-10-27 MED ORDER — FAMOTIDINE 20 MG PO TABS
ORAL_TABLET | ORAL | Status: AC
  Administered 2017-10-27: 20 mg via ORAL
  Filled 2017-10-27: qty 1

## 2017-10-27 MED ORDER — BUPIVACAINE-EPINEPHRINE 0.25% -1:200000 IJ SOLN
INTRAMUSCULAR | Status: DC | PRN
  Administered 2017-10-27: 20 mL

## 2017-10-27 MED ORDER — PROPOFOL 10 MG/ML IV BOLUS
INTRAVENOUS | Status: DC | PRN
  Administered 2017-10-27: 30 mg via INTRAVENOUS
  Administered 2017-10-27: 140 mg via INTRAVENOUS
  Administered 2017-10-27: 30 mg via INTRAVENOUS

## 2017-10-27 MED ORDER — LIDOCAINE HCL (PF) 1 % IJ SOLN
INTRAMUSCULAR | Status: AC
  Filled 2017-10-27: qty 2

## 2017-10-27 MED ORDER — LACTATED RINGERS IV SOLN
INTRAVENOUS | Status: DC
  Administered 2017-10-27: 10:00:00 via INTRAVENOUS

## 2017-10-27 MED ORDER — PROPOFOL 10 MG/ML IV BOLUS
INTRAVENOUS | Status: AC
  Filled 2017-10-27: qty 20

## 2017-10-27 MED ORDER — CHLORHEXIDINE GLUCONATE CLOTH 2 % EX PADS: 6.0000 | MEDICATED_PAD | Freq: Once | CUTANEOUS | Status: DC

## 2017-10-27 MED ORDER — HYDROCODONE-ACETAMINOPHEN 5-325 MG PO TABS: 1.0000 | ORAL_TABLET | Freq: Four times a day (QID) | ORAL | Status: DC | PRN

## 2017-10-27 MED ORDER — OXYCODONE HCL 5 MG PO TABS
5.0000 mg | ORAL_TABLET | Freq: Once | ORAL | Status: AC | PRN
  Administered 2017-10-27: 5 mg via ORAL

## 2017-10-27 MED ORDER — SODIUM CHLORIDE 0.9 % IJ SOLN
INTRAMUSCULAR | Status: AC
  Administered 2017-10-27: 10 mL
  Filled 2017-10-27: qty 10

## 2017-10-27 MED ORDER — LIDOCAINE HCL (CARDIAC) PF 100 MG/5ML IV SOSY
PREFILLED_SYRINGE | INTRAVENOUS | Status: DC | PRN
  Administered 2017-10-27: 100 mg via INTRAVENOUS

## 2017-10-27 MED ORDER — MIDAZOLAM HCL 2 MG/2ML IJ SOLN
INTRAMUSCULAR | Status: DC | PRN
  Administered 2017-10-27: 2 mg via INTRAVENOUS

## 2017-10-27 MED ORDER — LIDOCAINE HCL (PF) 2 % IJ SOLN
INTRAMUSCULAR | Status: AC
  Filled 2017-10-27: qty 10

## 2017-10-27 MED ORDER — FAMOTIDINE 20 MG PO TABS
20.0000 mg | ORAL_TABLET | Freq: Once | ORAL | Status: AC
  Administered 2017-10-27: 20 mg via ORAL

## 2017-10-27 SURGICAL SUPPLY — 37 items
ADH SKN CLS APL DERMABOND .7 (GAUZE/BANDAGES/DRESSINGS) ×1
APPLIER CLIP 9.375 SM OPEN (CLIP) ×3
APR CLP SM 9.3 20 MLT OPN (CLIP) ×1
BINDER BREAST LRG (GAUZE/BANDAGES/DRESSINGS) ×2 IMPLANT
BLADE SURG 15 STRL LF DISP TIS (BLADE) ×1 IMPLANT
BLADE SURG 15 STRL SS (BLADE) ×3
CANISTER SUCT 1200ML W/VALVE (MISCELLANEOUS) ×3 IMPLANT
CHLORAPREP W/TINT 26ML (MISCELLANEOUS) ×3 IMPLANT
CLIP APPLIE 9.375 SM OPEN (CLIP) ×1 IMPLANT
COVER PROBE FLX POLY STRL (MISCELLANEOUS) IMPLANT
DERMABOND ADVANCED (GAUZE/BANDAGES/DRESSINGS) ×2
DERMABOND ADVANCED .7 DNX12 (GAUZE/BANDAGES/DRESSINGS) ×1 IMPLANT
DEVICE DUBIN SPECIMEN MAMMOGRA (MISCELLANEOUS) IMPLANT
DRAPE CHEST BREAST 77X106 FENE (MISCELLANEOUS) ×3 IMPLANT
DRAPE LAPAROTOMY TRNSV 106X77 (MISCELLANEOUS) ×1 IMPLANT
DRSG GAUZE FLUFF 36X18 (GAUZE/BANDAGES/DRESSINGS) ×2 IMPLANT
ELECT CAUTERY BLADE 6.4 (BLADE) ×3 IMPLANT
ELECT REM PT RETURN 9FT ADLT (ELECTROSURGICAL) ×3
ELECTRODE REM PT RTRN 9FT ADLT (ELECTROSURGICAL) ×1 IMPLANT
GAUZE SPONGE 4X4 12PLY STRL (GAUZE/BANDAGES/DRESSINGS) ×3 IMPLANT
GLOVE BIO SURGEON STRL SZ7 (GLOVE) ×3 IMPLANT
GOWN STRL REUS W/ TWL LRG LVL3 (GOWN DISPOSABLE) ×2 IMPLANT
GOWN STRL REUS W/TWL LRG LVL3 (GOWN DISPOSABLE) ×6
MARGIN MAP 10MM (MISCELLANEOUS) IMPLANT
NDL HYPO 25GX1X1/2 BEV (NEEDLE) ×1 IMPLANT
NDL HYPO 25X1 1.5 SAFETY (NEEDLE) ×1 IMPLANT
NEEDLE HYPO 25GX1X1/2 BEV (NEEDLE) ×3 IMPLANT
NEEDLE HYPO 25X1 1.5 SAFETY (NEEDLE) ×3 IMPLANT
PACK BASIN MINOR ARMC (MISCELLANEOUS) ×3 IMPLANT
SUT MNCRL 4-0 (SUTURE) ×3
SUT MNCRL 4-0 27XMFL (SUTURE) ×1
SUT SILK 2 0 SH (SUTURE) ×3 IMPLANT
SUT VIC AB 2-0 CT2 27 (SUTURE) ×15 IMPLANT
SUT VIC AB 3-0 SH 27 (SUTURE) ×15
SUT VIC AB 3-0 SH 27X BRD (SUTURE) ×1 IMPLANT
SUTURE MNCRL 4-0 27XMF (SUTURE) ×1 IMPLANT
SYR 10ML LL (SYRINGE) ×3 IMPLANT

## 2017-10-27 NOTE — Interval H&P Note (Signed)
History and Physical Interval Note:  10/27/2017 10:08 AM  Ann Kennedy  has presented today for surgery, with the diagnosis of papilloma right breast  The various methods of treatment have been discussed with the patient and family. After consideration of risks, benefits and other options for treatment, the patient has consented to  Procedure(s): BREAST CENTRAL DUCT EXCISION (Right) as a surgical intervention .  The patient's history has been reviewed, patient examined, no change in status, stable for surgery.  I have reviewed the patient's chart and labs.  Questions were answered to the patient's satisfaction.     [place]

## 2017-10-27 NOTE — Anesthesia Procedure Notes (Signed)
Procedure Name: LMA Insertion Date/Time: 10/27/2017 10:46 AM Performed by: Eben Burow, CRNA Pre-anesthesia Checklist: Patient identified, Emergency Drugs available, Suction available, Patient being monitored and Timeout performed Patient Re-evaluated:Patient Re-evaluated prior to induction Oxygen Delivery Method: Circle system utilized Preoxygenation: Pre-oxygenation with 100% oxygen Induction Type: IV induction Ventilation: Mask ventilation without difficulty LMA: LMA inserted LMA Size: 4.0 Number of attempts: 1 Placement Confirmation: positive ETCO2 and breath sounds checked- equal and bilateral Tube secured with: Tape Dental Injury: Teeth and Oropharynx as per pre-operative assessment

## 2017-10-27 NOTE — Discharge Instructions (Signed)

## 2017-10-27 NOTE — Anesthesia Postprocedure Evaluation (Signed)
Anesthesia Post Note  Patient: Ann Kennedy  Procedure(s) Performed: BREAST CENTRAL DUCT EXCISION (Right )  Patient location during evaluation: PACU Anesthesia Type: General Level of consciousness: awake and alert Pain management: pain level controlled Vital Signs Assessment: post-procedure vital signs reviewed and stable Respiratory status: spontaneous breathing, nonlabored ventilation, respiratory function stable and patient connected to nasal cannula oxygen Cardiovascular status: blood pressure returned to baseline and stable Postop Assessment: no apparent nausea or vomiting Anesthetic complications: no     Last Vitals:  Vitals:   10/27/17 1348 10/27/17 1437  BP: (!) 104/55 116/60  Pulse: 68 79  Resp: 16 16  Temp: (!) 35.7 C   SpO2: 100% 100%    Last Pain:  Vitals:   10/27/17 1439  TempSrc:   PainSc: 5                  Precious Haws Jasmynn Pfalzgraf

## 2017-10-27 NOTE — Transfer of Care (Signed)
Immediate Anesthesia Transfer of Care Note  Patient: Ann Kennedy  Procedure(s) Performed: BREAST CENTRAL DUCT EXCISION (Right )  Patient Location: PACU  Anesthesia Type:General  Level of Consciousness: drowsy  Airway & Oxygen Therapy: Patient Spontanous Breathing and Patient connected to face mask oxygen  Post-op Assessment: Report given to RN and Post -op Vital signs reviewed and stable  Post vital signs: Reviewed and stable  Last Vitals:  Vitals Value Taken Time  BP 99/59 10/27/2017 12:47 PM  Temp    Pulse 74 10/27/2017 12:47 PM  Resp    SpO2 100 % 10/27/2017 12:47 PM  Vitals shown include unvalidated device data.  Last Pain:  Vitals:   10/27/17 0938  TempSrc: Tympanic         Complications: No apparent anesthesia complications

## 2017-10-27 NOTE — OR Nursing (Signed)
Discharger instructions discussed with pt and family. Both voice understanding.

## 2017-10-27 NOTE — Progress Notes (Signed)
Notified Dr. Dahlia Byes of pink-tinged drainage from right breast. Per MD, change fluffs and breast binder. No other orders at this time.

## 2017-10-27 NOTE — Anesthesia Preprocedure Evaluation (Addendum)
Anesthesia Evaluation  Patient identified by MRN, date of birth, ID band Patient awake    Reviewed: Allergy & Precautions, H&P , NPO status , Patient's Chart, lab work & pertinent test results  History of Anesthesia Complications (+) PROLONGED EMERGENCE and history of anesthetic complications  Airway Mallampati: I  TM Distance: >3 FB Neck ROM: full    Dental  (+) Chipped   Pulmonary neg pulmonary ROS, neg shortness of breath,           Cardiovascular Exercise Tolerance: Good (-) angina(-) Past MI and (-) DOE negative cardio ROS       Neuro/Psych  Headaches, PSYCHIATRIC DISORDERS  Neuromuscular disease    GI/Hepatic negative GI ROS, Neg liver ROS,   Endo/Other  negative endocrine ROS  Renal/GU      Musculoskeletal  (+) Arthritis ,   Abdominal   Peds  Hematology negative hematology ROS (+)   Anesthesia Other Findings Past Medical History: No date: Anemia No date: Arthritis No date: Complication of anesthesia     Comment:  WAKES UP DURING COLONOSCOPIES-TAKES MORE ANESTHESIA FOR               HER-HARD TO WAKE UP AFTER BTL 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)  Past Surgical History: No date: ABDOMINAL SURGERY     Comment:  Puetz-Jeghers Syndrome-POLYPS No date: APPENDECTOMY 2016: BREAST BIOPSY; Right     Comment:  benign No date: COLONOSCOPY No date: TUBAL LIGATION  BMI    Body Mass Index:  24.17 kg/m      Reproductive/Obstetrics negative OB ROS                             Anesthesia Physical Anesthesia Plan  ASA: III  Anesthesia Plan: General LMA   Post-op Pain Management:    Induction: Intravenous  PONV Risk Score and Plan: Ondansetron, Midazolam and Treatment may vary due to age or medical condition  Airway Management Planned: LMA  Additional Equipment:   Intra-op Plan:   Post-operative Plan: Extubation in OR  Informed Consent: I have reviewed the patients  History and Physical, chart, labs and discussed the procedure including the risks, benefits and alternatives for the proposed anesthesia with the patient or authorized representative who has indicated his/her understanding and acceptance.   Dental Advisory Given  Plan Discussed with: Anesthesiologist, CRNA and Surgeon  Anesthesia Plan Comments: (Patient consented for risks of anesthesia including but not limited to:  - adverse reactions to medications - damage to teeth, lips or other oral mucosa - sore throat or hoarseness - Damage to heart, brain, lungs or loss of life  Patient voiced understanding.)       Anesthesia Quick Evaluation

## 2017-10-27 NOTE — Anesthesia Post-op Follow-up Note (Signed)
Anesthesia QCDR form completed.        

## 2017-10-27 NOTE — Op Note (Signed)
Pre-operative Diagnosis: Right breast Mass c/w papilloma, , Abnormal nipple discharge    Post-operative Diagnosis: Same   Surgeon: Caroleen Hamman, MD FACS  Anesthesia: General  Procedure:  Partial mastectomy Ultrasound guided with wide margins  Findings: Large Central Mass c/w intraductal papilloma.  Specimen x-rays showed evidence of the 8:00 clip.  Because of the density of the tissue we were unable to visualize the 6:00 o'cclock  clip.  I performed a lumpectomy with a wide margins in order to clear her intraductal papilloma that it was massive and incorporated the whole portion of the central breast.  Estimated Blood Loss: 50cc         Drains: None         Specimens: partial mastectomy with labels  Complications: none           Condition: Stable  Procedure Details  The patient was seen again in the Holding Room. The benefits, complications, treatment options, and expected outcomes were discussed with the patient. The risks of bleeding, infection, recurrence of symptoms, failure to resolve symptoms, hematoma, seroma, open wound, cosmetic deformity, and the need for further surgery were discussed.  The patient was taken to Operating Room, identified as Ann Kennedy and the procedure verified.  A Time Out was held and the above information confirmed.  Prior to the induction of general anesthesia, antibiotic prophylaxis was administered. VTE prophylaxis was in place. Appropriate anesthesia was then administered and tolerated well. The chest was prepped with Chloraprep and draped in the sterile fashion. The patient was positioned in the supine position.   Ultrasound was used to locate both legs at 6:00 and 8:00 in the standard fashion.  We marked our incision to be able to incorporate both areas of the clip.  We also were able to palpate a central mass and express brown nipple discharge from multiple ducts within the nipple. The overall incision was created and random cutaneous flaps  where developed.  We were able to perform a lumpectomy but a require wide excision with good margins.  This was a larger than expected papilloma but we realized that it was massive because every time we were able to dissect through the [place]ie we were able to see the wall of the papilloma that it was brown.  In order to have adequate margins we to remove a significant portion of the central breast almost to the point of doing a simple mastectomy.  Again this was the only way to incorporate the massive mass that was centrally located.  The lumpectomy was performed with electrocautery we also were able to orient the specimen and send her for x-ray.  The x-rays show evidence of one clip at 8:00 but the 6:00 was difficult to visualize.  I was very confident that I perform a very wide excision and that the 6:00 clip was there but it was just not visualized due to the density of this mass.  I discussed with our radiologist the operative findings and she assured me that the clip that was not visualized was more medial and because of my excision I did feel very confident that I incorporated that clip and that was not being able to visualize given the density of the tissue. Attention was turned to the cavity were I made sure that hemostasis was controlled with electrocautery.  Given the significant defect in the central portion I was able to mobilize some of the remaining breast tissue and were able to close and feel the defect using interrupted  2-0 Vicryl's.  A second layer of interrupted 3-0 Vicryl's was performed and the skin was closed with a running 4-0 Monocryl.  Dermabond was placed.  A supportive bra as well as fluffs were placed in the standard fashion.  Patient was taken to the recovery room in stable condition where a postoperative chest film has been ordered.   Caroleen Hamman, MD, FACS

## 2017-10-28 ENCOUNTER — Telehealth: Payer: Self-pay | Admitting: Surgery

## 2017-10-28 NOTE — Telephone Encounter (Signed)
Patient is asking about the glue on the incision and wanted to know if it would stay there permanently. I told her it would most likely be off within 2 weeks. She was told she could shower in 48 hours from surgery but do not scrub over the area.

## 2017-10-28 NOTE — Telephone Encounter (Signed)
Patient left a voicemail - would like the nurse to call her. She had surgery 8/1 and has questions about some symptoms she is experiencing

## 2017-11-07 ENCOUNTER — Encounter: Payer: Self-pay | Admitting: Surgery

## 2017-11-07 ENCOUNTER — Ambulatory Visit (INDEPENDENT_AMBULATORY_CARE_PROVIDER_SITE_OTHER): Payer: BLUE CROSS/BLUE SHIELD | Admitting: Surgery

## 2017-11-07 VITALS — BP 112/71 | HR 89 | Temp 97.4°F | Ht 66.5 in | Wt 150.6 lb

## 2017-11-07 DIAGNOSIS — Z09 Encounter for follow-up examination after completed treatment for conditions other than malignant neoplasm: Secondary | ICD-10-CM

## 2017-11-07 NOTE — Patient Instructions (Signed)
Please see your follow up appointment listed below.  °

## 2017-11-07 NOTE — Progress Notes (Signed)
S/p Central Duct excision / lumpectomy Doing well No fevers or chills Path still Pending D/W Dr. Reuel Derby and we are awaiting for a second opinion  PE NAD wound healing well, no infection, some retraction of the nipple expected given the size of the lumpectomy.  A/P Depending on path results may need completion mastectomy w reconstruction vs watchful waiting D/W the pt in detail and she understands

## 2017-11-15 LAB — SURGICAL PATHOLOGY

## 2017-11-16 ENCOUNTER — Ambulatory Visit (INDEPENDENT_AMBULATORY_CARE_PROVIDER_SITE_OTHER): Payer: BLUE CROSS/BLUE SHIELD | Admitting: Surgery

## 2017-11-16 ENCOUNTER — Encounter: Payer: Self-pay | Admitting: Surgery

## 2017-11-16 VITALS — BP 115/73 | HR 101 | Temp 97.7°F | Ht 66.5 in | Wt 148.0 lb

## 2017-11-16 DIAGNOSIS — Z09 Encounter for follow-up examination after completed treatment for conditions other than malignant neoplasm: Secondary | ICD-10-CM

## 2017-11-16 MED ORDER — OXYCODONE HCL 5 MG PO TABS: 5.0000 mg | ORAL_TABLET | Freq: Four times a day (QID) | ORAL | 0 refills | Status: AC | PRN

## 2017-11-16 NOTE — Progress Notes (Signed)
S/p Right lumpectomy Path d/w pt in detail ADENOMYOEPITHELIOMATOUS WITH ATYPICAL FEATURES.  - MARGINS ARE NEGATIVE (0.5 MM TO ANTERIOR / SUPERFICIAL MARGIN).  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

## 2017-11-16 NOTE — Patient Instructions (Addendum)
We will call you with an appointment to have the MRI of the right breast in 3 months.    Patient will call office to let us know when to schedule due to her week on week off schedule.

## 2017-11-18 ENCOUNTER — Telehealth: Payer: Self-pay | Admitting: *Deleted

## 2017-11-18 NOTE — Telephone Encounter (Signed)
Patient was seen in the office on 11/16/17 and was prescribed oxycodone and when she had it filled and got home she realized she can not take it because it makes her sick feeling. She wanted to know if she can get some other kind of pain medicine.

## 2017-11-18 NOTE — Telephone Encounter (Signed)
Spoke with patient and let her know that Dr.Pabon has instructed to take 1/2 the pill and see if this works better for her.  Patient stated she did not realize it was oxycodone before filling the prescription otherwise she would not have had it filled.  I ask her to try taking 1/2 the pill and see if this helps with the nausea and she stated she would and would call back on Monday to let us know if she needed something different.

## 2017-11-18 NOTE — Telephone Encounter (Signed)
Patients is calling again, asking about this medication. Please call patient and advise.

## 2017-11-21 ENCOUNTER — Telehealth: Payer: Self-pay

## 2017-11-21 ENCOUNTER — Telehealth: Payer: Self-pay | Admitting: *Deleted

## 2017-11-21 MED ORDER — HYDROCODONE-ACETAMINOPHEN 5-325 MG PO TABS: 1.0000 | ORAL_TABLET | Freq: Four times a day (QID) | ORAL | 0 refills | Status: DC | PRN

## 2017-11-21 NOTE — Telephone Encounter (Signed)
Patient called and was instructed to take 1/2 the pill of oxycodone to see if that worked better for the patient. She stated that it still made her nauseous. Patient would like to try something else.

## 2017-11-21 NOTE — Telephone Encounter (Signed)
Left message letting patient know that prescription can be picked up today.

## 2017-12-30 ENCOUNTER — Other Ambulatory Visit: Payer: Self-pay

## 2017-12-30 DIAGNOSIS — D241 Benign neoplasm of right breast: Secondary | ICD-10-CM

## 2018-01-18 ENCOUNTER — Other Ambulatory Visit: Payer: Self-pay

## 2018-01-18 ENCOUNTER — Encounter: Payer: Self-pay | Admitting: Surgery

## 2018-01-18 ENCOUNTER — Ambulatory Visit (INDEPENDENT_AMBULATORY_CARE_PROVIDER_SITE_OTHER): Payer: BLUE CROSS/BLUE SHIELD | Admitting: Surgery

## 2018-01-18 VITALS — BP 115/77 | HR 103 | Temp 97.7°F | Ht 66.5 in | Wt 147.4 lb

## 2018-01-18 DIAGNOSIS — D241 Benign neoplasm of right breast: Secondary | ICD-10-CM

## 2018-01-18 MED ORDER — GABAPENTIN 300 MG PO CAPS: 300.0000 mg | ORAL_CAPSULE | Freq: Three times a day (TID) | ORAL | 0 refills | Status: AC | PRN

## 2018-01-18 NOTE — Progress Notes (Signed)
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  She experiences some intermittent pain on the right side. Mild to moderate in intensity No fevers  PE NAD Breast incision healing well, some distortion of the nipple. No infection, seroma or abscess. No further masses  A/p Doing well Some residual pain, we will try gabapentin RTC 3 months w MRI NO surgical complications May need scar revision in the near future but we will need to make sure there is no other potential recurrence disease on the right breast

## 2018-01-18 NOTE — Patient Instructions (Signed)
Patient needs to be scheduled for an MRI for left breast and return to see Dr. Dahlia Byes after.

## 2018-01-23 ENCOUNTER — Encounter: Payer: Self-pay | Admitting: *Deleted

## 2018-01-23 ENCOUNTER — Telehealth: Payer: Self-pay | Admitting: *Deleted

## 2018-01-23 NOTE — Telephone Encounter (Signed)
Letter made for patient .  Patient has no restriction at this time

## 2018-01-23 NOTE — Telephone Encounter (Signed)
Patient called and wanted to see if she could get a work note to go back to work, she will come pick it up when it is ready

## 2018-03-08 ENCOUNTER — Telehealth: Payer: Self-pay | Admitting: *Deleted

## 2018-03-08 NOTE — Telephone Encounter (Signed)
Message left for patient to call the office.   Patient was supposed to arrange for a breast MRI in December 2019 and a follow up appointment with Dr. Dahlia Byes after MRI.   I do not see that the breast MRI has been scheduled at present.

## 2018-04-13 ENCOUNTER — Telehealth: Payer: Self-pay | Admitting: *Deleted

## 2018-04-13 NOTE — Telephone Encounter (Signed)
Another message left for patient to call the office.

## 2018-04-20 ENCOUNTER — Telehealth: Payer: Self-pay | Admitting: *Deleted

## 2018-04-20 NOTE — Telephone Encounter (Signed)
Patient contacted today and states that her insurance has changed and she is waiting on new card to come in.   She is hoping that this will come in soon.   Patient aware to let us know once she gets MRI scheduled so we can arrange follow up with Dr. Dahlia Byes.   The patient will be placed in March recalls to make sure the above gets arranged if she happens not to call back.

## 2018-05-04 ENCOUNTER — Telehealth: Payer: Self-pay

## 2018-05-04 NOTE — Telephone Encounter (Signed)
Message left for patient to see if she has scheduled her Breast MRI for this year. She will need a follow up appointment with Dr Dahlia Byes for after this.

## 2018-06-01 NOTE — Telephone Encounter (Signed)
Patient states that she will call us once she is ready to set up an MRI and follow up with Dr Dahlia Byes. She asks that we not contact her further about this.

## 2018-07-07 ENCOUNTER — Encounter: Payer: Self-pay | Admitting: Emergency Medicine

## 2018-07-07 ENCOUNTER — Ambulatory Visit
Admission: EM | Admit: 2018-07-07 | Discharge: 2018-07-07 | Disposition: A | Discharge status: Home / Self Care | Payer: Self-pay | Attending: Family Medicine | Admitting: Family Medicine

## 2018-07-07 ENCOUNTER — Ambulatory Visit (INDEPENDENT_AMBULATORY_CARE_PROVIDER_SITE_OTHER): Payer: Self-pay

## 2018-07-07 ENCOUNTER — Other Ambulatory Visit: Payer: Self-pay

## 2018-07-07 DIAGNOSIS — S63501A Unspecified sprain of right wrist, initial encounter: Secondary | ICD-10-CM

## 2018-07-07 DIAGNOSIS — L309 Dermatitis, unspecified: Secondary | ICD-10-CM

## 2018-07-07 DIAGNOSIS — M778 Other enthesopathies, not elsewhere classified: Secondary | ICD-10-CM

## 2018-07-07 DIAGNOSIS — M25531 Pain in right wrist: Secondary | ICD-10-CM

## 2018-07-07 NOTE — ED Triage Notes (Signed)
Pt c/o right wrist pain. She states that she fell about a month twice and she has been hurting since. She has pain when she moves it and trying to grip. She also states that she has am itchy area on both of her elbows and she has been taking benadryl.

## 2018-07-07 NOTE — Discharge Instructions (Signed)
Over the counter ibuprofen Over the counter cortisone ointment Follow up with orthopedist for wrist pain

## 2018-07-24 NOTE — ED Provider Notes (Signed)
[facility]    CSN: [id number] Arrival date & time: 07/07/18  1350     History   Chief Complaint Chief Complaint  Patient presents with  . Wrist Pain    right    HPI Ann Kennedy is a 48 y.o. female.   48 yo female with a c/o right wrist pain x 1 month after falling twice. Pain is worse when trying to grip or with certain movements. Patient also states she has itchy rash on the elbows bilaterally. Denies any fevers, chills.    The history is provided by the patient.  Wrist Pain     Past Medical History:  Diagnosis Date  . Anemia   . Arthritis   . Complication of anesthesia    WAKES UP DURING COLONOSCOPIES-TAKES MORE ANESTHESIA FOR HER-HARD TO WAKE UP AFTER BTL  . 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 Breast mass, right   . Anemia, unspecified 08/09/2017  . 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 Adjustment disorder with depressed mood 02/11/2012  . Abnormal mammogram 02/03/2012  . 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 Chronic tension headaches 02/03/2012  . Neck pain 02/03/2012  . 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 ABDOMINAL SURGERY     Puetz-Jeghers Syndrome-POLYPS  . APPENDECTOMY    . BREAST BIOPSY Right 2016   benign  . BREAST DUCTAL SYSTEM EXCISION Right 10/27/2017   Procedure: BREAST CENTRAL DUCT EXCISION;  Surgeon: Jules Husbands, MD;  Location: [facility] ORS;  Service: General;  Laterality: Right;  . BREAST EXCISIONAL BIOPSY Right 10/27/2017   lumpectomy path pending   . COLONOSCOPY    . TUBAL LIGATION      OB History   No obstetric history on file.      Home Medications    Prior to Admission medications   Medication Sig Start Date End Date Taking? Authorizing Provider  ferrous sulfate 325 (65 FE) MG tablet Take 325 mg by mouth See admin instructions. Take occasionally   Yes [provider]  Multiple Vitamin  (MULTIVITAMIN) tablet Take 1 tablet by mouth as needed.   Yes [provider]  gabapentin (NEURONTIN) 300 MG capsule Take 1 capsule (300 mg total) by mouth 3 (three) times daily with meals as needed. 01/18/18 02/17/18  Pabon, Marjory Lies, MD  HYDROcodone-acetaminophen (NORCO/VICODIN) 5-325 MG tablet Take 1-2 tablets by mouth every 6 (six) hours as needed for moderate pain. Patient not taking: Reported on 01/18/2018 10/27/17   Pabon, Bea Graff F, MD  Na Sulfate-K Sulfate-Mg Sulf 17.5-3.13-1.6 GM/177ML SOLN At 6 PM day before procedure, complete steps 1-4 using one 6 ounce bottle.  At 5 AM the day of procedure, repeat steps 1-4 using 2nd bottle. 11/20/14   [provider]  oxyCODONE (ROXICODONE) 5 MG immediate release tablet Take 1 tablet (5 mg total) by mouth every 6 (six) hours as needed. Patient not taking: Reported on 01/18/2018 11/16/17 11/16/18  Jules Husbands, MD    Family History Family History  Problem Relation Age of Onset  . Colon cancer Father   . Other Father        Peutz-Jegher syndrome    Social History Social History   Tobacco Use  . Smoking status: Never Smoker  . Smokeless tobacco: Never Used  Substance Use Topics  . Alcohol use: Yes    Alcohol/week: 2.0 standard drinks    Types: 2 Glasses  of wine per week    Comment: WINE OCC  . Drug use: Never     Allergies   Aspirin   Review of Systems Review of Systems   Physical Exam Triage Vital Signs ED Triage Vitals  Enc Vitals Group     BP 07/07/18 1417 119/71     Pulse Rate 07/07/18 1417 80     Resp 07/07/18 1417 18     Temp 07/07/18 1417 98.3 F (36.8 C)     Temp Source 07/07/18 1417 Oral     SpO2 07/07/18 1417 100 %     Weight 07/07/18 1412 146 lb (66.2 kg)     Height 07/07/18 1412 5' 6.5" (1.689 m)     Head Circumference --      Peak Flow --      Pain Score 07/07/18 1412 6     Pain Loc --      Pain Edu? --      Excl. in [place]? --    No data found.  Updated Vital Signs BP 119/71 (BP  Location: Left Arm)   Pulse 80   Temp 98.3 F (36.8 C) (Oral)   Resp 18   Ht 5' 6.5" (1.689 m)   Wt 66.2 kg   LMP 06/19/2018 (Approximate)   SpO2 100%   BMI 23.21 kg/m   Visual Acuity Right Eye Distance:   Left Eye Distance:   Bilateral Distance:    Right Eye Near:   Left Eye Near:    Bilateral Near:     Physical Exam Vitals signs and nursing note reviewed.  Constitutional:      General: She is not in acute distress.    Appearance: Normal appearance. She is not toxic-appearing or diaphoretic.  Musculoskeletal:     Right wrist: She exhibits tenderness. She exhibits normal range of motion, no swelling, no effusion, no crepitus, no deformity and no laceration.  Skin:    Comments: Scaly, erythematous, dry rash on elbow skin  Neurological:     Mental Status: She is alert.      UC Treatments / Results  Labs (all labs ordered are listed, but only abnormal results are displayed) Labs Reviewed - No data to display  EKG None  Radiology No results found.  Procedures Procedures (including critical care time)  Medications Ordered in UC Medications - No data to display  Initial Impression / Assessment and Plan / UC Course  I have reviewed the triage vital signs and the nursing notes.  Pertinent labs & imaging results that were available during my care of the patient were reviewed by me and considered in my medical decision making (see chart for details).      Final Clinical Impressions(s) / UC Diagnoses   Final diagnoses:  Sprain of right wrist, initial encounter  Right wrist tendonitis  Eczema, unspecified type     Discharge Instructions     Over the counter ibuprofen Over the counter cortisone ointment Follow up with orthopedist for wrist pain    ED Prescriptions    None     1. x-ray results and diagnosis reviewed with patient 2. rx as per orders above; reviewed possible side effects, interactions, risks and benefits  3. Recommend supportive  treatment as above  4. Follow-up prn if symptoms worsen or don't improve  Controlled Substance Prescriptions [place] Controlled Substance Registry consulted? Not Applicable   Norval Gable, MD 07/24/18 1902

## 2018-07-26 IMAGING — US US BREAST*R* LIMITED INC AXILLA
1 series · 13 of 24 positions shown · non-contrast
Comparison: Previous exam(s).

CLINICAL DATA: 46-year-old female with bloody RIGHT nipple
discharge for 2 months. Also for annual bilateral mammograms.

EXAM:
DIGITAL DIAGNOSTIC BILATERAL MAMMOGRAM WITH CAD AND TOMO
ULTRASOUND RIGHT BREAST

[Series 1: us breast*right* limited inc axilla · 0.08mm/px · 13 of 24 slices shown]
[im 1/24]
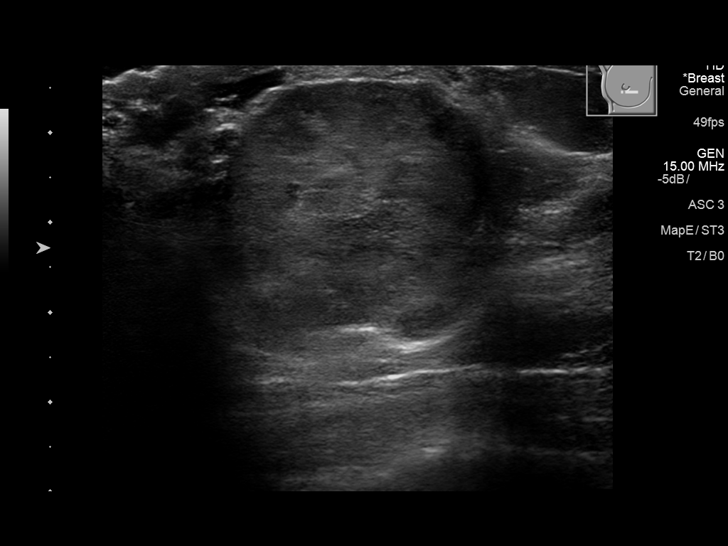
[im 3/24]
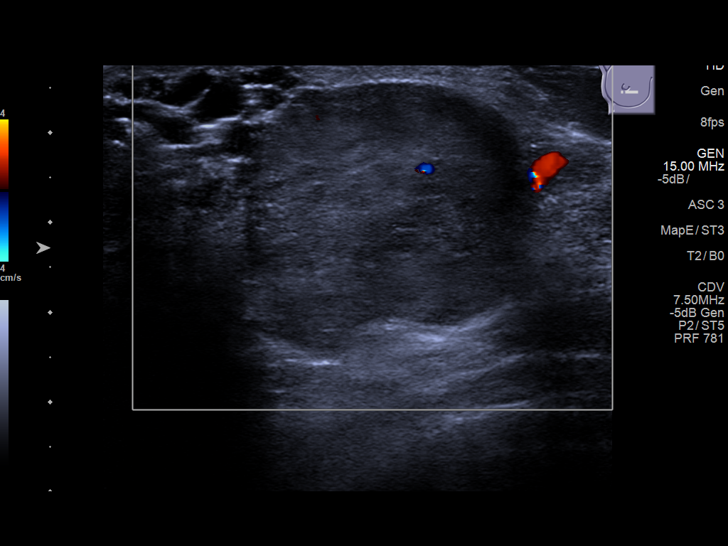
[im 5/24]
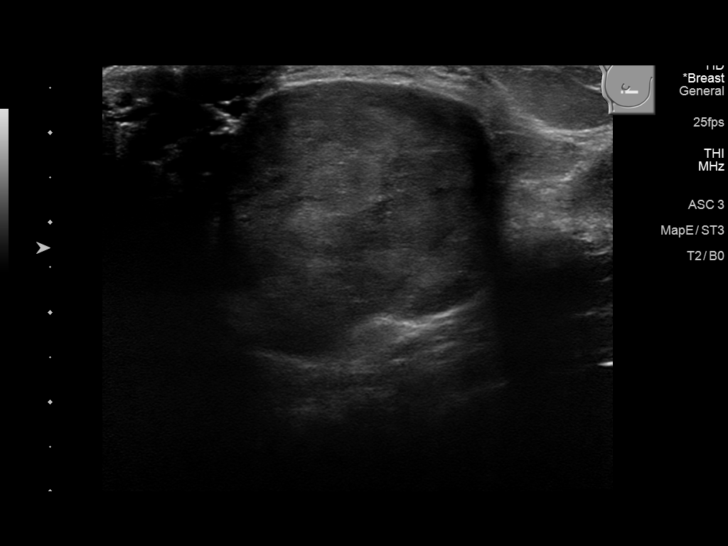
[im 7/24]
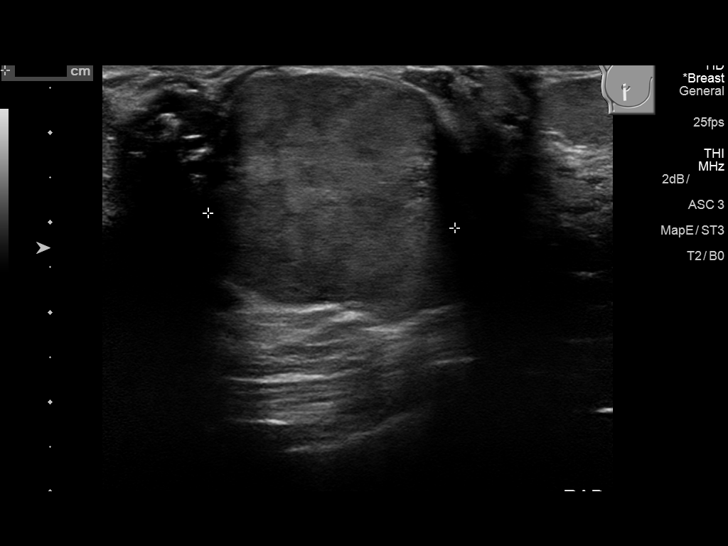
[im 9/24]
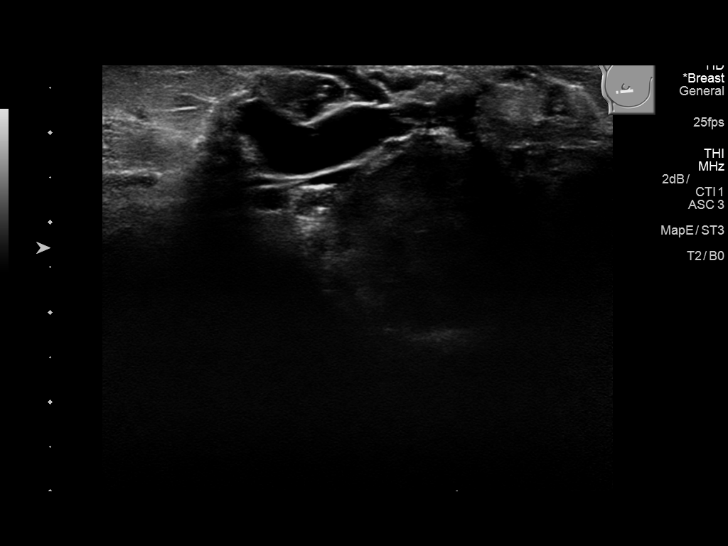
[im 11/24]
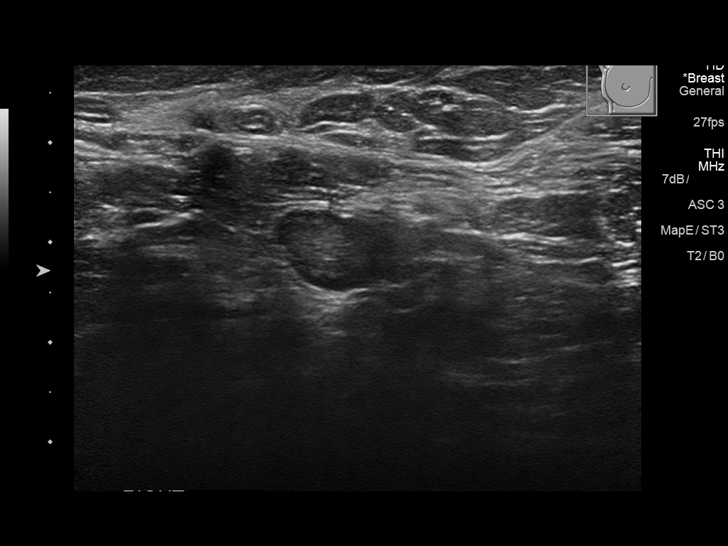
[im 13/24]
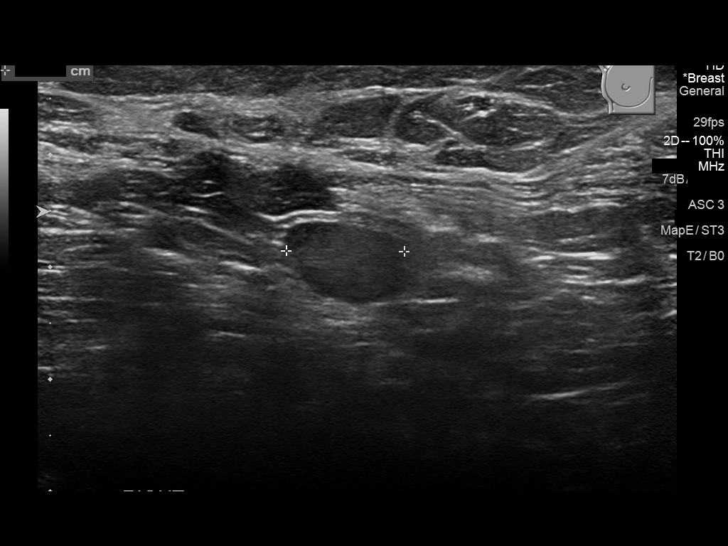
[im 14/24]
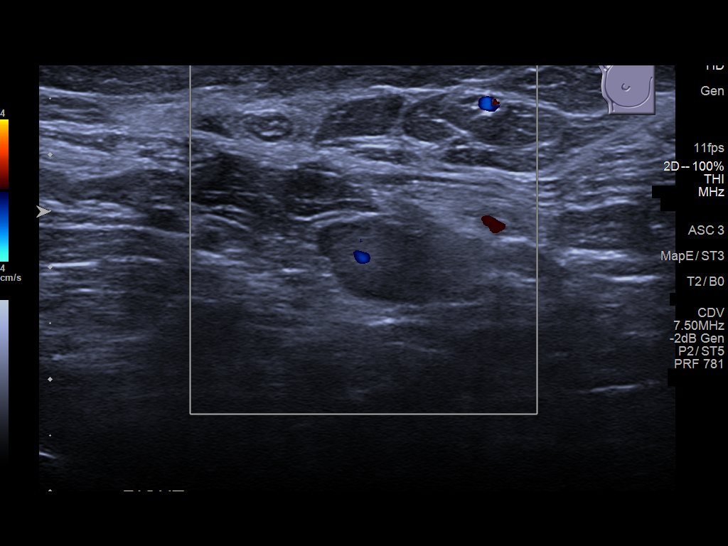
[im 16/24]
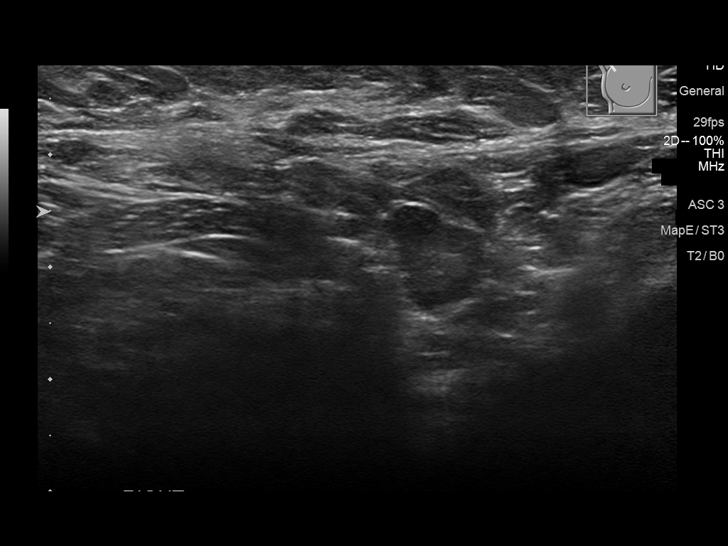
[im 18/24]
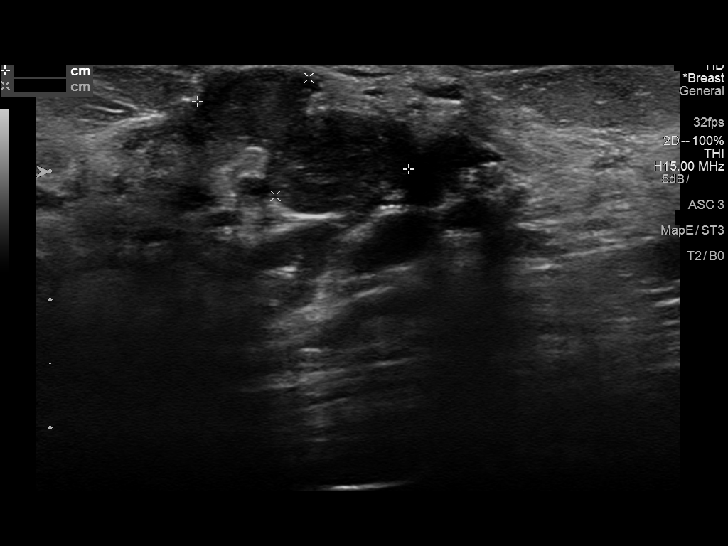
[im 20/24]
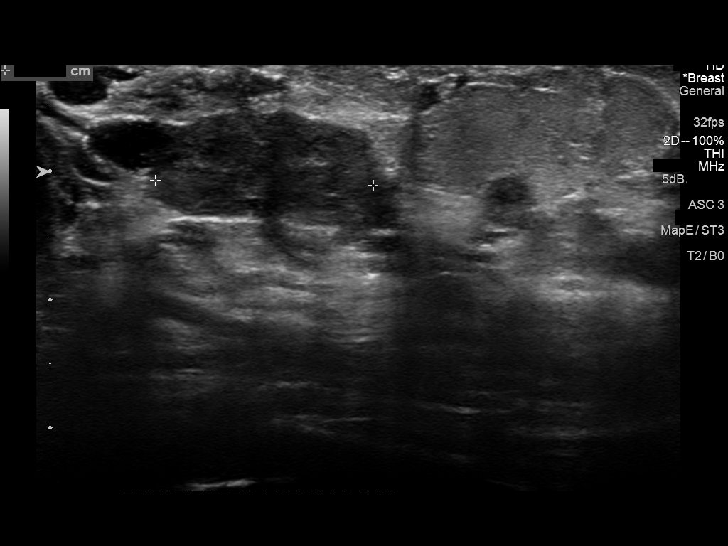
[im 22/24]
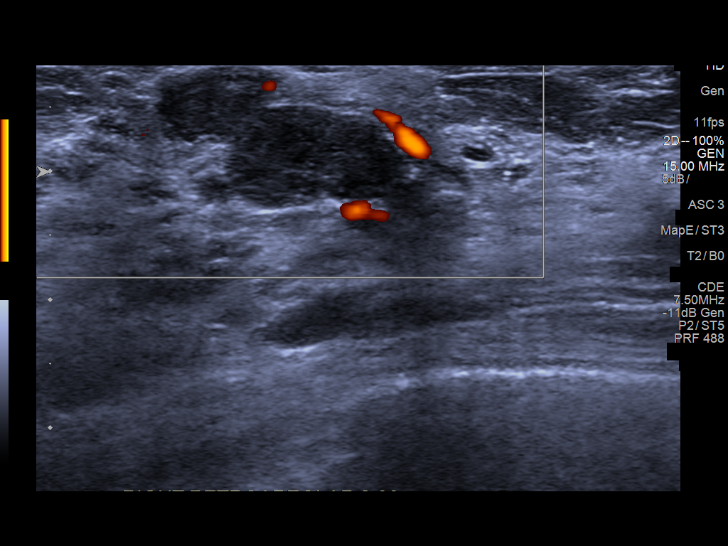
[im 24/24]
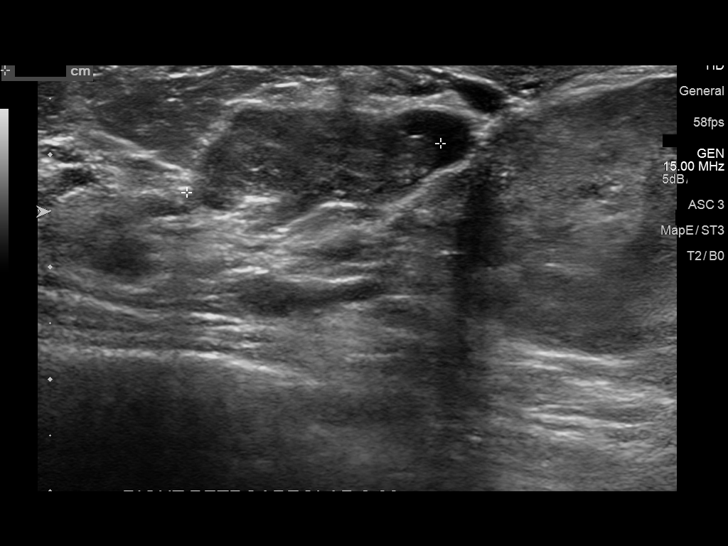

[13 of 24 positions shown; findings below may reference images not displayed]

ACR Breast Density Category c: The breast tissue is heterogeneously
dense, which may obscure small masses.
FINDINGS: 2D/3D full field views of both breasts and a spot compression view
of the RIGHT breast demonstrate a new circumscribed round mass in
the RETROAREOLAR RIGHT breast.

No other suspicious mass, distortion or worrisome calcifications
noted bilaterally.

Mammographic images were processed with CAD.

On physical exam, a firm palpable mass in the RETROAREOLAR RIGHT
breast identified. Clear RIGHT nipple discharge was elicited from a
single duct.

Targeted ultrasound is performed, showing the following:

A 3.1 x 2.8 x 2.8 cm circumscribed round solid mass in the LOWER
RETROAREOLAR RIGHT breast.

A 1.7 x 1 x 2.3 cm intraductal mass at the 8 o'clock position of the
RETROAREOLAR RIGHT breast.

No abnormal RIGHT axillary lymph nodes identified.
IMPRESSION: 1. New indeterminate 3.1 cm LOWER RETROAREOLAR RIGHT breast mass.
Tissue sampling is recommended.
2. 2.3 cm intraductal LOWER OUTER RETROAREOLAR RIGHT breast mass,
likely accounting for this patient's nipple discharge. Tissue
sampling is recommended.

RECOMMENDATION:
Ultrasound-guided biopsies of 3.1 cm LOWER RETROAREOLAR RIGHT breast
mass and 2.3 cm intraductal LOWER OUTER RETROAREOLAR RIGHT breast
mass. These biopsies will be arranged.

I have discussed the findings and recommendations with the patient.
Results were also provided in writing at the conclusion of the
visit. If applicable, a reminder letter will be sent to the patient
regarding the next appointment.

BI-RADS CATEGORY  4: Suspicious.

## 2019-10-29 IMAGING — MG MM DIGITAL DIAGNOSTIC BILAT W/ TOMO W/ CAD
6 of 10 series · 6 of 30 positions shown · non-contrast
Comparison: Previous exam(s).

CLINICAL DATA: 46-year-old female with bloody RIGHT nipple
discharge for 2 months. Also for annual bilateral mammograms.

EXAM:
DIGITAL DIAGNOSTIC BILATERAL MAMMOGRAM WITH CAD AND TOMO
ULTRASOUND RIGHT BREAST

[R MLO synth-2D (1 of 2)]
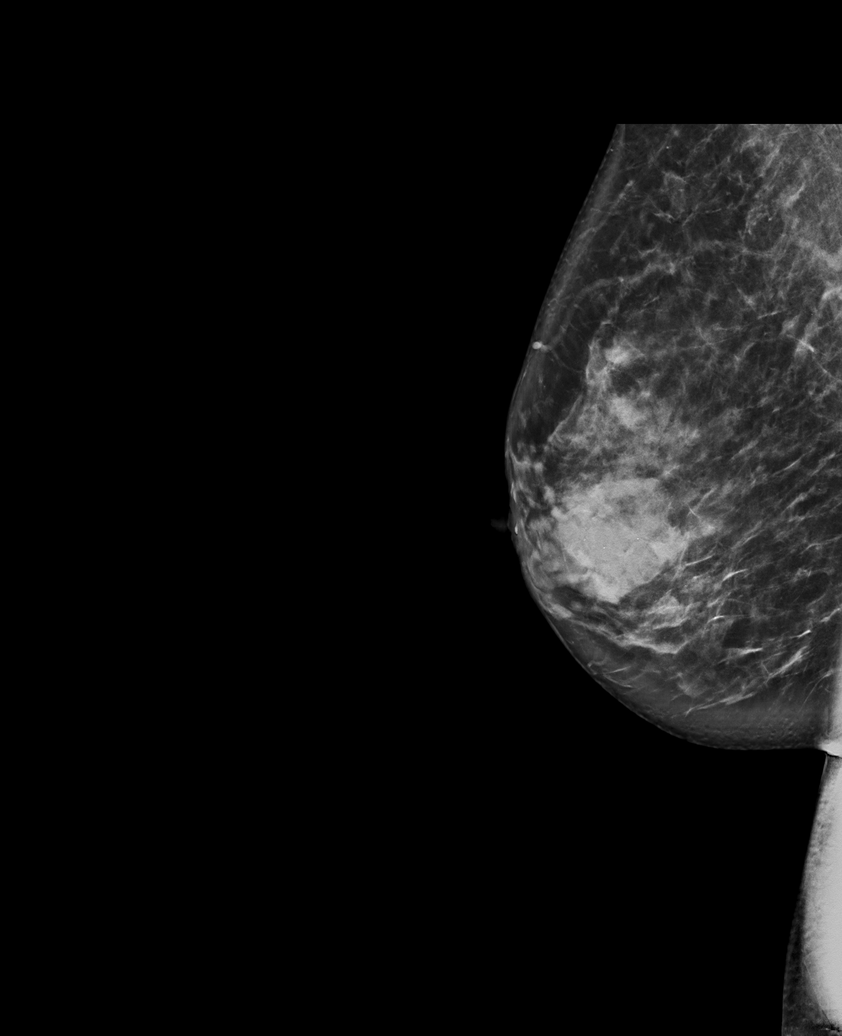

[R CC synth-2D]
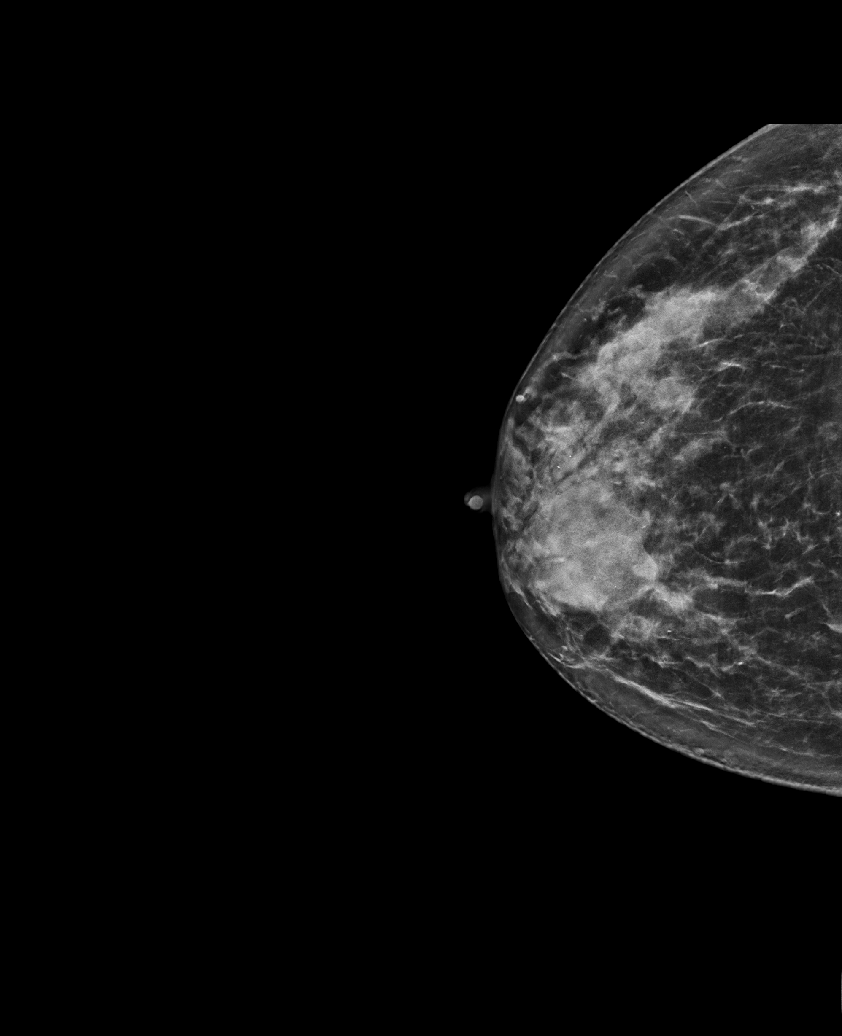

[L CC synth-2D]
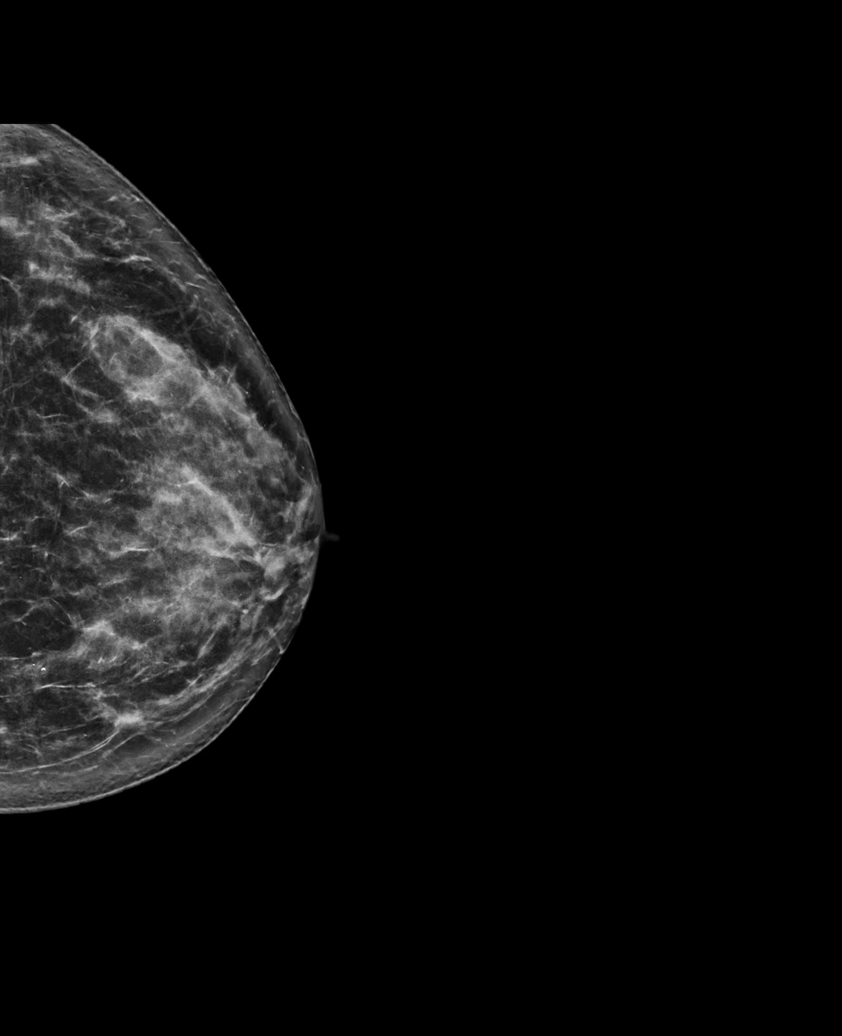

[L MLO synth-2D]
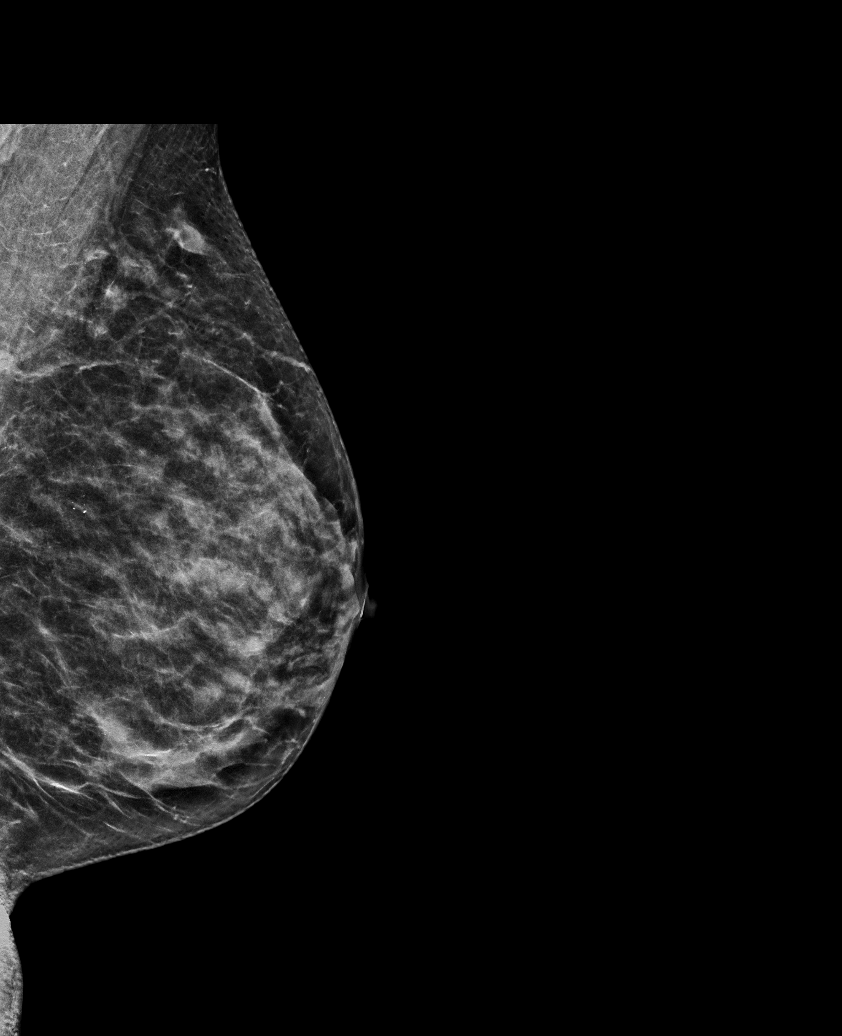

[R MLO synth-2D (2 of 2)]
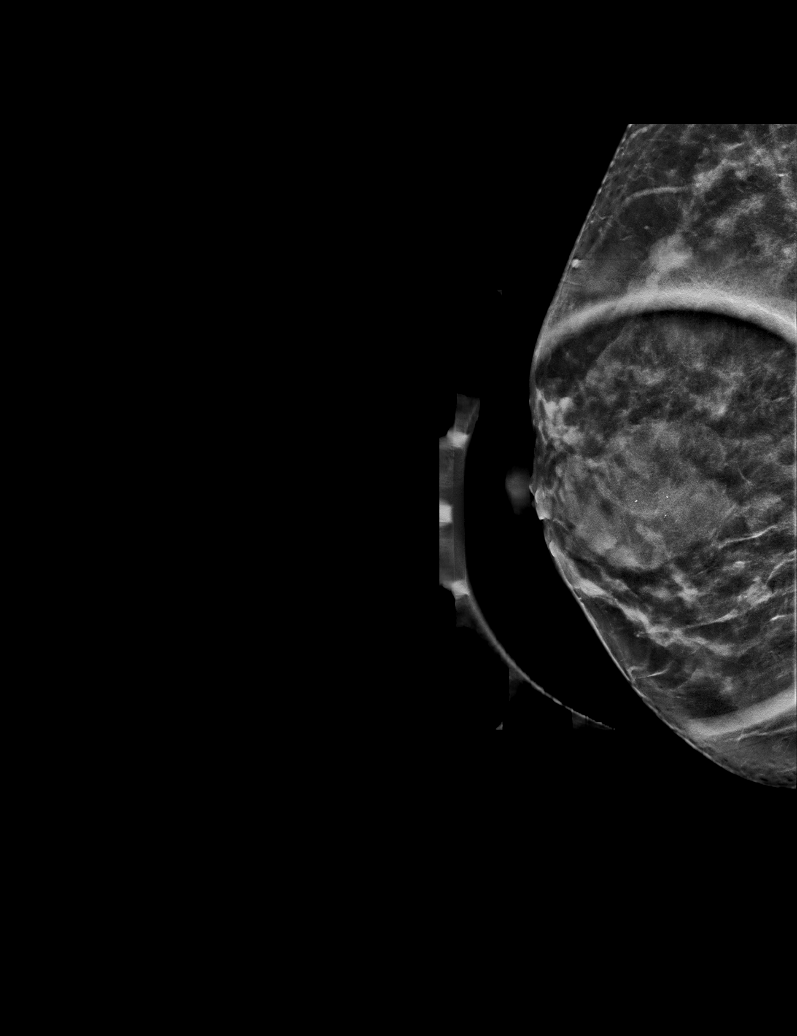

[R MLO tomo · tomo slice 33/64.0]
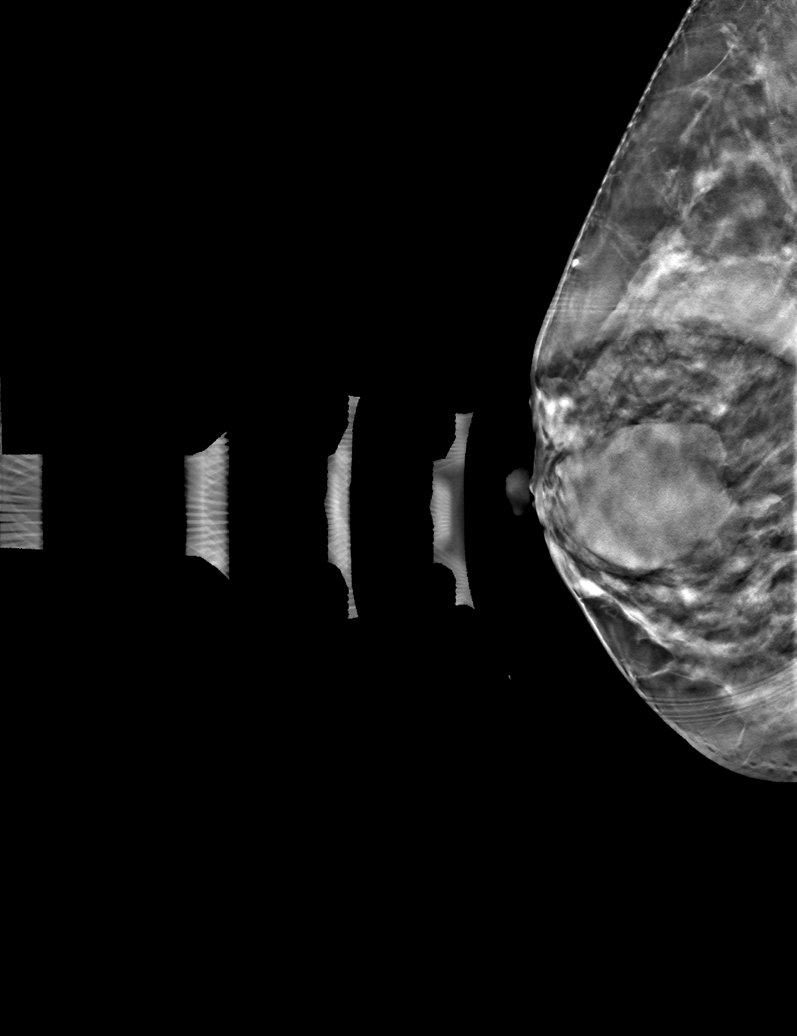

[6 of 30 positions shown; findings below may reference images not displayed]

ACR Breast Density Category c: The breast tissue is heterogeneously
dense, which may obscure small masses.
FINDINGS: 2D/3D full field views of both breasts and a spot compression view
of the RIGHT breast demonstrate a new circumscribed round mass in
the RETROAREOLAR RIGHT breast.

No other suspicious mass, distortion or worrisome calcifications
noted bilaterally.

Mammographic images were processed with CAD.

On physical exam, a firm palpable mass in the RETROAREOLAR RIGHT
breast identified. Clear RIGHT nipple discharge was elicited from a
single duct.

Targeted ultrasound is performed, showing the following:

A 3.1 x 2.8 x 2.8 cm circumscribed round solid mass in the LOWER
RETROAREOLAR RIGHT breast.

A 1.7 x 1 x 2.3 cm intraductal mass at the 8 o'clock position of the
RETROAREOLAR RIGHT breast.

No abnormal RIGHT axillary lymph nodes identified.
IMPRESSION: 1. New indeterminate 3.1 cm LOWER RETROAREOLAR RIGHT breast mass.
Tissue sampling is recommended.
2. 2.3 cm intraductal LOWER OUTER RETROAREOLAR RIGHT breast mass,
likely accounting for this patient's nipple discharge. Tissue
sampling is recommended.

RECOMMENDATION:
Ultrasound-guided biopsies of 3.1 cm LOWER RETROAREOLAR RIGHT breast
mass and 2.3 cm intraductal LOWER OUTER RETROAREOLAR RIGHT breast
mass. These biopsies will be arranged.

I have discussed the findings and recommendations with the patient.
Results were also provided in writing at the conclusion of the
visit. If applicable, a reminder letter will be sent to the patient
regarding the next appointment.

BI-RADS CATEGORY  4: Suspicious.

## 2021-04-08 ENCOUNTER — Encounter (HOSPITAL_COMMUNITY): Payer: Self-pay | Admitting: *Deleted

## 2021-04-08 ENCOUNTER — Other Ambulatory Visit: Payer: Self-pay

## 2021-04-08 ENCOUNTER — Emergency Department (HOSPITAL_COMMUNITY)
Admission: EM | Admit: 2021-04-08 | Discharge: 2021-04-08 | Disposition: A | Discharge status: Home / Self Care | Payer: Self-pay | Attending: Emergency Medicine | Admitting: Emergency Medicine

## 2021-04-08 DIAGNOSIS — R109 Unspecified abdominal pain: Secondary | ICD-10-CM | POA: Insufficient documentation

## 2021-04-08 DIAGNOSIS — K5900 Constipation, unspecified: Secondary | ICD-10-CM | POA: Insufficient documentation

## 2021-04-08 DIAGNOSIS — Z5321 Procedure and treatment not carried out due to patient leaving prior to being seen by health care provider: Secondary | ICD-10-CM | POA: Insufficient documentation

## 2021-04-08 HISTORY — DX: Benign neoplasm of connective and other soft tissue, unspecified: D21.9

## 2021-04-08 LAB — COMPREHENSIVE METABOLIC PANEL
ALT: 14 U/L (ref 0–44)
AST: 27 U/L (ref 15–41)
Albumin: 3.9 g/dL (ref 3.5–5.0)
Alkaline Phosphatase: 48 U/L (ref 38–126)
Anion gap: 5 (ref 5–15)
BUN: 8 mg/dL (ref 6–20)
CO2: 23 mmol/L (ref 22–32)
Calcium: 8.5 mg/dL — ABNORMAL LOW (ref 8.9–10.3)
Chloride: 105 mmol/L (ref 98–111)
Creatinine, Ser: 0.69 mg/dL (ref 0.44–1.00)
GFR, Estimated: >60 mL/min (ref >60)
Glucose, Bld: 100 mg/dL — ABNORMAL HIGH (ref 70–99)
Potassium: 3.7 mmol/L (ref 3.5–5.1)
Sodium: 133 mmol/L — ABNORMAL LOW (ref 135–145)
Total Bilirubin: 0.6 mg/dL (ref 0.3–1.2)
Total Protein: 7 g/dL (ref 6.5–8.1)

## 2021-04-08 LAB — URINALYSIS, ROUTINE W REFLEX MICROSCOPIC
Bilirubin Urine: NEGATIVE
Glucose, UA: NEGATIVE mg/dL
Hgb urine dipstick: NEGATIVE
Ketones, ur: NEGATIVE mg/dL
Leukocytes,Ua: NEGATIVE
Nitrite: NEGATIVE
Protein, ur: NEGATIVE mg/dL
Specific Gravity, Urine: 1.006 (ref 1.005–1.030)
pH: 7 (ref 5.0–8.0)

## 2021-04-08 LAB — I-STAT BETA HCG BLOOD, ED (MC, WL, AP ONLY): I-stat hCG, quantitative: <5 m[IU]/mL (ref <5)

## 2021-04-08 LAB — CBC
HCT: 29.3 % — ABNORMAL LOW (ref 36.0–46.0)
Hemoglobin: 8.5 g/dL — ABNORMAL LOW (ref 12.0–15.0)
MCH: 22.1 pg — ABNORMAL LOW (ref 26.0–34.0)
MCHC: 29 g/dL — ABNORMAL LOW (ref 30.0–36.0)
MCV: 76.3 fL — ABNORMAL LOW (ref 80.0–100.0)
Platelets: 253 10*3/uL (ref 150–400)
RBC: 3.84 MIL/uL — ABNORMAL LOW (ref 3.87–5.11)
RDW: 19.8 % — ABNORMAL HIGH (ref 11.5–15.5)
WBC: 9.7 10*3/uL (ref 4.0–10.5)
nRBC: 0 % (ref 0.0–0.2)

## 2021-04-08 LAB — LIPASE, BLOOD: Lipase: 25 U/L (ref 11–51)

## 2021-04-08 NOTE — ED Provider Notes (Signed)
Patient was placed in waiting room without MSE.  I have called patient's name 3 times in the ER at 49min intervals with no response.    Tedd Sias, [place] 04/08/21 2037    Luna Fuse, MD 04/08/21 2112

## 2021-04-08 NOTE — ED Triage Notes (Signed)
Abd pain with constipation. Last BM Friday, has taken laxatives without relief. No N/V Stomach described as feeling tight.

## 2021-04-09 ENCOUNTER — Emergency Department (HOSPITAL_COMMUNITY): Payer: Self-pay

## 2021-04-09 ENCOUNTER — Encounter (HOSPITAL_COMMUNITY): Payer: Self-pay

## 2021-04-09 ENCOUNTER — Emergency Department (HOSPITAL_COMMUNITY)
Admission: EM | Admit: 2021-04-09 | Discharge: 2021-04-10 | Disposition: A | Discharge status: Home / Self Care | Payer: Self-pay | Attending: Emergency Medicine | Admitting: Emergency Medicine

## 2021-04-09 ENCOUNTER — Ambulatory Visit (HOSPITAL_COMMUNITY)
Admission: RE | Admit: 2021-04-09 | Discharge: 2021-04-09 | Disposition: A | Discharge status: Home / Self Care | Payer: Self-pay | Source: Ambulatory Visit | Attending: Internal Medicine | Admitting: Internal Medicine

## 2021-04-09 ENCOUNTER — Other Ambulatory Visit: Payer: Self-pay

## 2021-04-09 VITALS — BP 116/64 | HR 80 | Temp 97.9°F | Resp 18

## 2021-04-09 DIAGNOSIS — K561 Intussusception: Secondary | ICD-10-CM | POA: Insufficient documentation

## 2021-04-09 DIAGNOSIS — R102 Pelvic and perineal pain: Secondary | ICD-10-CM

## 2021-04-09 DIAGNOSIS — R1032 Left lower quadrant pain: Secondary | ICD-10-CM

## 2021-04-09 LAB — CBC WITH DIFFERENTIAL/PLATELET
Abs Immature Granulocytes: 0.05 10*3/uL (ref 0.00–0.07)
Basophils Absolute: 0.1 10*3/uL (ref 0.0–0.1)
Basophils Relative: 1 %
Eosinophils Absolute: 0.2 10*3/uL (ref 0.0–0.5)
Eosinophils Relative: 1 %
HCT: 30.9 % — ABNORMAL LOW (ref 36.0–46.0)
Hemoglobin: 8.9 g/dL — ABNORMAL LOW (ref 12.0–15.0)
Immature Granulocytes: 0 %
Lymphocytes Relative: 13 %
Lymphs Abs: 1.5 10*3/uL (ref 0.7–4.0)
MCH: 22.2 pg — ABNORMAL LOW (ref 26.0–34.0)
MCHC: 28.8 g/dL — ABNORMAL LOW (ref 30.0–36.0)
MCV: 77.1 fL — ABNORMAL LOW (ref 80.0–100.0)
Monocytes Absolute: 1.2 10*3/uL — ABNORMAL HIGH (ref 0.1–1.0)
Monocytes Relative: 11 %
Neutro Abs: 8.6 10*3/uL — ABNORMAL HIGH (ref 1.7–7.7)
Neutrophils Relative %: 74 %
Platelets: 246 10*3/uL (ref 150–400)
RBC: 4.01 MIL/uL (ref 3.87–5.11)
RDW: 19.4 % — ABNORMAL HIGH (ref 11.5–15.5)
WBC: 11.7 10*3/uL — ABNORMAL HIGH (ref 4.0–10.5)
nRBC: 0 % (ref 0.0–0.2)

## 2021-04-09 LAB — URINALYSIS, ROUTINE W REFLEX MICROSCOPIC
Bilirubin Urine: NEGATIVE
Glucose, UA: NEGATIVE mg/dL
Hgb urine dipstick: NEGATIVE
Ketones, ur: 80 mg/dL — AB
Leukocytes,Ua: NEGATIVE
Nitrite: NEGATIVE
Protein, ur: 30 mg/dL — AB
Specific Gravity, Urine: 1.024 (ref 1.005–1.030)
pH: 5 (ref 5.0–8.0)

## 2021-04-09 LAB — COMPREHENSIVE METABOLIC PANEL
ALT: 14 U/L (ref 0–44)
AST: 33 U/L (ref 15–41)
Albumin: 3.8 g/dL (ref 3.5–5.0)
Alkaline Phosphatase: 54 U/L (ref 38–126)
Anion gap: 12 (ref 5–15)
BUN: 6 mg/dL (ref 6–20)
CO2: 22 mmol/L (ref 22–32)
Calcium: 9.1 mg/dL (ref 8.9–10.3)
Chloride: 105 mmol/L (ref 98–111)
Creatinine, Ser: 0.61 mg/dL (ref 0.44–1.00)
GFR, Estimated: >60 mL/min (ref >60)
Glucose, Bld: 88 mg/dL (ref 70–99)
Potassium: 3.9 mmol/L (ref 3.5–5.1)
Sodium: 139 mmol/L (ref 135–145)
Total Bilirubin: 0.7 mg/dL (ref 0.3–1.2)
Total Protein: 6.8 g/dL (ref 6.5–8.1)

## 2021-04-09 LAB — I-STAT BETA HCG BLOOD, ED (MC, WL, AP ONLY): I-stat hCG, quantitative: <5 m[IU]/mL (ref <5)

## 2021-04-09 LAB — LACTIC ACID, PLASMA: Lactic Acid, Venous: 1.3 mmol/L (ref 0.5–1.9)

## 2021-04-09 LAB — LIPASE, BLOOD: Lipase: 23 U/L (ref 11–51)

## 2021-04-09 MED ORDER — LACTATED RINGERS IV BOLUS
1000.0000 mL | Freq: Once | INTRAVENOUS | Status: AC
  Administered 2021-04-09: 1000 mL via INTRAVENOUS

## 2021-04-09 MED ORDER — IOHEXOL 350 MG/ML SOLN
75.0000 mL | Freq: Once | INTRAVENOUS | Status: AC | PRN
  Administered 2021-04-09: 75 mL via INTRAVENOUS

## 2021-04-09 MED ORDER — OXYCODONE-ACETAMINOPHEN 5-325 MG PO TABS: 1.0000 | ORAL_TABLET | Freq: Once | ORAL | Status: DC

## 2021-04-09 MED ORDER — OXYCODONE-ACETAMINOPHEN 5-325 MG PO TABS
1.0000 | ORAL_TABLET | Freq: Once | ORAL | Status: AC
  Administered 2021-04-09: 1 via ORAL
  Filled 2021-04-09: qty 1

## 2021-04-09 MED ORDER — FENTANYL CITRATE PF 50 MCG/ML IJ SOSY
50.0000 ug | PREFILLED_SYRINGE | Freq: Once | INTRAMUSCULAR | Status: AC
  Administered 2021-04-09: 50 ug via INTRAVENOUS
  Filled 2021-04-09: qty 1

## 2021-04-09 NOTE — ED Notes (Signed)
Patient is being discharged from the Urgent Care and sent to the Emergency Department via POV . Per Dr Lanny Cramp, patient is in need of higher level of care due to limited access to tests and studies needed to diagnose patient. Patient is aware and verbalizes understanding of plan of care.  Vitals:   04/09/21 1319  BP: 116/64  Pulse: 80  Resp: 18  Temp: 97.9 F (36.6 C)  SpO2: 96%

## 2021-04-09 NOTE — Discharge Instructions (Signed)
Please go to the emergency department to be evaluated further You need imaging of the abdomen to further evaluate the mass palpated in your lower abdomen

## 2021-04-09 NOTE — ED Triage Notes (Signed)
Pt presents with lower abdominal pain X 3 days.  Pt has Hx of fibroids and has had polyps removed.

## 2021-04-09 NOTE — ED Notes (Signed)
Patient refusing temperature. States she will let them take it in the back

## 2021-04-09 NOTE — ED Provider Triage Note (Signed)
Emergency Medicine Provider Triage Evaluation Note  Ann Kennedy , a 51 y.o. female  was evaluated in triage.  Patient presents with left lower quadrant and suprapubic pelvic pain that started last night.  Is gradually worsening and has been constant.  She is not sexually active.  No vaginal discharge or abnormal bleeding.  She has never had these pains before.  She does have a history of uterine fibroids.  Review of Systems  Positive:  Negative:   Physical Exam  BP 114/73 (BP Location: Right Arm)    Pulse 86    Temp (!) 97.3 F (36.3 C) (Oral)    Resp 16    Ht 5\' 7"  (1.702 m)    Wt 64.9 kg    SpO2 100%    BMI 22.40 kg/m  Gen:   Awake, no distress   Resp:  Normal effort  MSK:   Moves extremities without difficulty  Other:    Medical Decision Making  Medically screening exam initiated at 3:01 PM.  Appropriate orders placed.  Kathreen Cosier was informed that the remainder of the evaluation will be completed by another provider, this initial triage assessment does not replace that evaluation, and the importance of remaining in the ED until their evaluation is complete.  Pelvic ultrasound to rule out torsion   Adolphus Birchwood, PA-C 04/09/21 1502

## 2021-04-09 NOTE — ED Provider Notes (Signed)
[facility]PART[facility] Provider Note    CSN: [id number] Arrival date & time: 04/09/21 1347  History Chief Complaint  Patient presents with   Abdominal Pain    KALLI GREENFIELD is a 51 y.o. female with history of Peutz-Jegher's syndrome and uterine fibroids is visiting from [place] where she has a regular GI doctor who last did a colonoscopy about a year ago with multiple removed polyps. She presents today for evaluation of 3-4 days of sharp, severe cramping LLQ abdominal pain, not associated with nausea or vomiting. Waxes and wanes but does not go away. She is chronically constipated but took some laxatives and had a small BM. No trouble urinating.   Home Medications Prior to Admission medications   Medication Sig Start Date End Date Taking? Authorizing Provider  ferrous sulfate 325 (65 FE) MG tablet Take 325 mg by mouth See admin instructions. Take occasionally    [provider]  gabapentin (NEURONTIN) 300 MG capsule Take 1 capsule (300 mg total) by mouth 3 (three) times daily with meals as needed. 01/18/18 02/17/18  Pabon, Marjory Lies, MD  Multiple Vitamin (MULTIVITAMIN) tablet Take 1 tablet by mouth as needed.    [provider]  Na Sulfate-K Sulfate-Mg Sulf 17.5-3.13-1.6 GM/177ML SOLN At 6 PM day before procedure, complete steps 1-4 using one 6 ounce bottle.  At 5 AM the day of procedure, repeat steps 1-4 using 2nd bottle. 11/20/14   [provider]     Allergies    Aspirin and Penicillins   Review of Systems   Review of Systems Please see HPI for pertinent positives and negatives  Physical Exam BP 126/78    Pulse 89    Temp (!) 97.5 F (36.4 C) (Oral)    Resp 16    Ht 5\' 7"  (1.702 m)    Wt 64.9 kg    SpO2 100%    BMI 22.40 kg/m   Physical Exam Vitals and nursing note reviewed.  Constitutional:      Appearance: Normal appearance.  HENT:     Head: Normocephalic and atraumatic.     Nose: Nose normal.     Mouth/Throat:     Mouth:  Mucous membranes are moist.  Eyes:     Extraocular Movements: Extraocular movements intact.     Conjunctiva/sclera: Conjunctivae normal.  Cardiovascular:     Rate and Rhythm: Normal rate.  Pulmonary:     Effort: Pulmonary effort is normal.     Breath sounds: Normal breath sounds.  Abdominal:     General: Abdomen is flat.     Palpations: Abdomen is soft.     Tenderness: There is abdominal tenderness in the left lower quadrant. There is no guarding. Negative signs include Murphy's sign and McBurney's sign.  Musculoskeletal:        General: No swelling. Normal range of motion.     Cervical back: Neck supple.  Skin:    General: Skin is warm and dry.  Neurological:     General: No focal deficit present.     Mental Status: She is alert.  Psychiatric:        Mood and Affect: Mood normal.    ED Results / Procedures / Treatments   EKG None  Procedures Procedures  Medications Ordered in the ED Medications  oxyCODONE-acetaminophen (PERCOCET/ROXICET) 5-325 MG per tablet 1 tablet (1 tablet Oral Not Given 04/09/21 2313)  oxyCODONE-acetaminophen (PERCOCET/ROXICET) 5-325 MG per tablet 1 tablet (1 tablet Oral Given 04/09/21 1512)  iohexol (OMNIPAQUE) 350  MG/ML injection 75 mL (75 mLs Intravenous Contrast Given 04/09/21 1909)  fentaNYL (SUBLIMAZE) injection 50 mcg (50 mcg Intravenous Given 04/09/21 2240)  lactated ringers bolus 1,000 mL (1,000 mLs Intravenous New Bag/Given 04/09/21 2244)    Initial Impression and Plan  Patient here with LLQ abdominal pain, in setting of Peutz-Jegher's syndrome and uterine fibroids. She had labs and imaging tests done in triage. CBC shows anemia unchanged from yesterday ([facility] from [facility]) but no more recent for comparison. CMP is normal. Lipase is neg. UA with protein and ketones but no signs of infection. She had an US showing fibroids and a right ovarian cyst, not likely the cause of her pain. CT was then done showing two small areas of intussusception. I discussed  this finding with the patient, she reports she had intussusception when she was 51 years old. Will give IVF and pain medications for comfort and discuss with GI.   ED Course   Clinical Course as of 04/09/21 2315  Thu Apr 09, 2021  1815 [place] inconclusive for torsion. But the cystic structure that it finds is on the right side while patient symptoms are on the left side.  [GL]  2256 Spoke with Dr. Lyndel Safe, GI, who agrees with pain control and hydration. Recommends checking lactic acid. If elevated she may need surgical consult. If not she can either be discharged if pain is controlled or admitted for further pain control and hydration. Patient aware of plan.  [CS]  2309 Care of the patient signed out to Dr. Tyrone Nine at the change.  [CS]    Clinical Course User Index [CS] Truddie Hidden, MD [GL] Sherre Poot Cherlyn Roberts     MDM Rules/Calculators/A&P Medical Decision Making Problems Addressed: Intussusception of small bowel [facility]): acute illness or injury that poses a threat to life or bodily functions  Amount and/or Complexity of Data Reviewed Labs: ordered. Decision-making details documented in ED Course. Radiology: ordered and independent interpretation performed. Decision-making details documented in ED Course.  Risk Parenteral controlled substances. Decision regarding hospitalization.    Final Clinical Impression(s) / ED Diagnoses Final diagnoses:  Intussusception of small bowel [facility])    Rx / DC Orders ED Discharge Orders     None        Truddie Hidden, MD 04/09/21 2315

## 2021-04-09 NOTE — ED Triage Notes (Signed)
Pt arrived POV from home c/o abdominal pain since Sunday. Pt states the pain is in her lower abdomen in the middle. Pt denies any N/V/D. Pt states she a hx of uterine fibroids.

## 2021-04-10 MED ORDER — MORPHINE SULFATE (PF) 4 MG/ML IV SOLN
4.0000 mg | Freq: Once | INTRAVENOUS | Status: AC
  Administered 2021-04-10: 4 mg via INTRAVENOUS
  Filled 2021-04-10: qty 1

## 2021-04-10 MED ORDER — ONDANSETRON 4 MG PO TBDP: ORAL_TABLET | ORAL | 0 refills | Status: AC

## 2021-04-10 MED ORDER — MORPHINE SULFATE 15 MG PO TABS: 7.5000 mg | ORAL_TABLET | ORAL | 0 refills | Status: DC | PRN

## 2021-04-10 MED ORDER — FENTANYL CITRATE PF 50 MCG/ML IJ SOSY: 50.0000 ug | PREFILLED_SYRINGE | Freq: Once | INTRAMUSCULAR | Status: DC

## 2021-04-10 MED ORDER — MORPHINE SULFATE 15 MG PO TABS: 7.5000 mg | ORAL_TABLET | ORAL | 0 refills | Status: AC | PRN

## 2021-04-10 NOTE — ED Provider Notes (Signed)
[place]    CSN: [id number] Arrival date & time: 04/09/21  1128      History   Chief Complaint Chief Complaint  Patient presents with   Abdominal Pain    HPI Ann Kennedy is a 51 y.o. 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  Patient says symptoms started fairly abruptly and has been persistent.  She denies any nausea or vomiting.  Pain was initially crampy but currently constant, severe, aggravated by palpation and denies any relieving factors.  No abdominal distention, nausea or vomiting.  Patient has a history of uterine fibroids.  No fever or chills.  No dysuria urgency or frequency.  Patient was seen in emergency department yesterday but did not wait to see a physician.  Labs drawn showed normal WBC, hemoglobin of 8.5.  Patient is not sexually active. 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      Home Medications    Prior to Admission medications   Medication Sig Start Date End Date Taking? Authorizing Provider  ferrous sulfate 325 (65 FE) MG tablet Take 325 mg by mouth See admin instructions. Take occasionally    [provider]  gabapentin (NEURONTIN) 300 MG capsule Take 1 capsule (300 mg total) by mouth 3 (three) times daily with meals as needed. 01/18/18 02/17/18  Pabon, Marjory Lies, MD  morphine (MSIR) 15 MG tablet Take 0.5 tablets (7.5 mg total) by mouth every 4 (four) hours as needed for severe pain. 04/10/21   Deno Etienne, DO  Multiple Vitamin (MULTIVITAMIN) tablet Take 1 tablet by mouth as needed.    [provider]  Na Sulfate-K Sulfate-Mg Sulf 17.5-3.13-1.6 GM/177ML SOLN At 6 PM day before procedure, complete steps 1-4 using one 6 ounce bottle.  At 5 AM the day of procedure, repeat steps 1-4 using 2nd bottle. 11/20/14   [provider]  ondansetron (ZOFRAN-ODT) 4 MG disintegrating tablet 4mg  ODT q4 hours prn nausea/vomit 04/10/21   Deno Etienne, DO    Family History Family History  Problem Relation Age of Onset   Colon cancer Father    Other Father        Peutz-Jegher syndrome    Social History Social History   Tobacco Use   Smoking  status: Never   Smokeless tobacco: Never  Vaping Use   Vaping Use: Never used  Substance Use Topics   Alcohol use: Yes    Alcohol/week: 2.0 standard drinks    Types: 2 Glasses of wine per week    Comment: WINE OCC   Drug use: Never     Allergies   Aspirin and Penicillins   Review of Systems Review of Systems  Constitutional: Negative.   Respiratory: Negative.    Gastrointestinal:  Positive for abdominal pain. Negative for blood in stool, diarrhea, nausea and vomiting.  Neurological: Negative.     Physical Exam Triage Vital Signs ED Triage Vitals  Enc Vitals Group     BP 04/09/21 1319 116/64     Pulse Rate 04/09/21 1319 80     Resp 04/09/21 1319 18      Temp 04/09/21 1319 97.9 F (36.6 C)     Temp Source 04/09/21 1319 Oral     SpO2 04/09/21 1319 96 %     Weight --      Height --      Head Circumference --      Peak Flow --      Pain Score 04/09/21 1322 10     Pain Loc --      Pain Edu? --      Excl. in [place]? --    No data found.  Updated Vital Signs BP 116/64 (BP Location: Right Arm)    Pulse 80    Temp 97.9 F (36.6 C) (Oral)    Resp 18    SpO2 96%   Visual Acuity Right Eye Distance:   Left Eye Distance:   Bilateral Distance:    Right Eye Near:   Left Eye Near:    Bilateral Near:     Physical Exam Vitals and nursing note reviewed.  Constitutional:      General: She is in acute distress.     Appearance: She is not ill-appearing.  Abdominal:     General: Bowel sounds are normal.     Palpations: Abdomen is soft. There is no hepatomegaly or splenomegaly.     Tenderness: There is abdominal tenderness in the suprapubic area and left lower quadrant. There is guarding. There is no rebound. Negative signs include McBurney's sign and psoas sign.     Hernia: No hernia is present.     Comments: Palpable mass in the left lower quadrant region.  Mass is arising from the pelvis.  Mass has a smooth surface, firm and measures about 2 inches above the pubic symphysis.  It is tender to touch.  Patient has guarding with no rebound tenderness.  Neurological:     Mental Status: She is alert.     UC Treatments / Results  Labs (all labs ordered are listed, but only abnormal results are displayed) Labs Reviewed - No data to display  EKG   Radiology   Procedures Procedures (including critical care time)  Medications Ordered in UC Medications - No data to display  Initial Impression / Assessment and Plan / UC Course  I have reviewed the triage vital signs and the nursing notes.  Pertinent labs & imaging results that were available during my care of the patient were reviewed by me and considered in my medical decision making  (see chart for details).     1.  Left lower quadrant abdominal pain: Patient is advised to go to the emergency department for CT scan of the abdomen.  The abdominal pain  has been persistent.  Pain may be from uterine or GI etiology. Final Clinical Impressions(s) / UC Diagnoses   Final diagnoses:  Left lower quadrant abdominal pain     Discharge Instructions      Please go to the emergency department to be evaluated further You need imaging of the abdomen to further evaluate the mass palpated in your lower abdomen    ED Prescriptions   None    PDMP not reviewed this encounter.   Chase Picket, MD 04/10/21 [phone]

## 2021-04-10 NOTE — Discharge Instructions (Signed)
Return for worsening pain, fever, inability to eat or drink.  Follow up with your GI in the office.

## 2021-04-10 NOTE — ED Provider Notes (Signed)
I received the patient in signout from Dr. Karle Starch, briefly the patient is a 51 year old female with a significant past medical history of Peutz Jaeger syndrome.  Found to have areas of intussusception on CT scan.  Discussed with GI and plan for lactic acid evaluation and reassessment.  Lactate is negative.  Patient is feeling a bit better would like to try and go home.  We will have her follow-up with her GI doctor in the office.  Short course of pain medicine.   Deno Etienne, DO 04/10/21 0013
